# Patient Record
Sex: Female | Born: 1975 | Hispanic: No | Marital: Married | Smoking: Never smoker
Health system: Southern US, Community
[De-identification: ages and names within clinical notes are randomized; demographics above are authoritative.]

## PROBLEM LIST (undated history)

## (undated) DIAGNOSIS — J45909 Unspecified asthma, uncomplicated: Secondary | ICD-10-CM

## (undated) DIAGNOSIS — T7840XA Allergy, unspecified, initial encounter: Secondary | ICD-10-CM

## (undated) HISTORY — DX: Allergy, unspecified, initial encounter: T78.40XA

---

## 2009-08-11 ENCOUNTER — Emergency Department (HOSPITAL_BASED_OUTPATIENT_CLINIC_OR_DEPARTMENT_OTHER): Admission: EM | Admit: 2009-08-11 | Discharge: 2009-08-11 | Payer: Self-pay | Admitting: Emergency Medicine

## 2010-05-17 LAB — URINALYSIS, ROUTINE W REFLEX MICROSCOPIC
Ketones, ur: NEGATIVE mg/dL
Protein, ur: NEGATIVE mg/dL
Urobilinogen, UA: 0.2 mg/dL (ref 0.0–1.0)

## 2010-11-17 ENCOUNTER — Encounter: Payer: Self-pay | Admitting: Physician Assistant

## 2011-07-01 ENCOUNTER — Emergency Department (INDEPENDENT_AMBULATORY_CARE_PROVIDER_SITE_OTHER)
Admission: EM | Admit: 2011-07-01 | Discharge: 2011-07-01 | Disposition: A | Discharge status: Home / Self Care | Payer: Self-pay | Source: Home / Self Care | Attending: Family Medicine | Admitting: Family Medicine

## 2011-07-01 DIAGNOSIS — J45909 Unspecified asthma, uncomplicated: Secondary | ICD-10-CM

## 2011-07-01 MED ORDER — ALBUTEROL SULFATE HFA 108 (90 BASE) MCG/ACT IN AERS: 1.0000 | INHALATION_SPRAY | Freq: Four times a day (QID) | RESPIRATORY_TRACT | Status: DC | PRN

## 2011-07-01 MED ORDER — AZITHROMYCIN 250 MG PO TABS: 250.0000 mg | ORAL_TABLET | Freq: Every day | ORAL | Status: AC

## 2011-07-01 MED ORDER — PREDNISONE 20 MG PO TABS: ORAL_TABLET | ORAL | Status: AC

## 2011-07-01 MED ORDER — METHYLPREDNISOLONE SODIUM SUCC 125 MG IJ SOLR
INTRAMUSCULAR | Status: AC
  Filled 2011-07-01: qty 2

## 2011-07-01 MED ORDER — ACETAMINOPHEN-CODEINE #3 300-30 MG PO TABS: 1.0000 | ORAL_TABLET | Freq: Four times a day (QID) | ORAL | Status: AC | PRN

## 2011-07-01 MED ORDER — IPRATROPIUM BROMIDE 0.02 % IN SOLN
0.5000 mg | Freq: Once | RESPIRATORY_TRACT | Status: AC
  Administered 2011-07-01: 0.5 mg via RESPIRATORY_TRACT

## 2011-07-01 MED ORDER — ALBUTEROL SULFATE (5 MG/ML) 0.5% IN NEBU
5.0000 mg | INHALATION_SOLUTION | Freq: Once | RESPIRATORY_TRACT | Status: AC
  Administered 2011-07-01: 5 mg via RESPIRATORY_TRACT

## 2011-07-01 MED ORDER — METHYLPREDNISOLONE SODIUM SUCC 125 MG IJ SOLR
125.0000 mg | Freq: Once | INTRAMUSCULAR | Status: AC
  Administered 2011-07-01: 125 mg via INTRAMUSCULAR

## 2011-07-01 MED ORDER — ALBUTEROL SULFATE (5 MG/ML) 0.5% IN NEBU
INHALATION_SOLUTION | RESPIRATORY_TRACT | Status: AC
  Filled 2011-07-01: qty 1

## 2011-07-01 NOTE — ED Notes (Signed)
ACCORDING TO PT PER PACIFIC TRANSLATOR PT STATES IM HAVING STOMACH PAINS WITH NAUSEA S/P COUGHING AND SOB,WHEEZING.DR.MORENO IN WITH.PT ABLE TO SPEAK IN SENTENCES AND STATES SHE FEELS MUCH BETTER.SATS 96-97% R/A POST BREATHING TREATMENT

## 2011-07-01 NOTE — ED Notes (Addendum)
Pt brought in by family friend for wheezing,sob that started today according to friend.pt only speaks [employer] LANGUAGE.ON ARRIVAL SOB,USING STOMACH MUSCLES FOR BREATHING ,UNABLE TO SPEAK AND WHEEZING.SATS 95% R/A.2 LITERS [place] APPLIED O2 INCREASED TO 100%.BREATHING TREATMENT STARTED WHILE TRIAGE ASSESS GIVEN TO PROVIDER. WILL USE PACIFIC TRANSLATION LINE WHEN PT ABLE TO SPEAK

## 2011-07-01 NOTE — ED Notes (Signed)
Pt breathing easier.

## 2011-07-01 NOTE — ED Notes (Signed)
Breathing treatment still going.

## 2011-07-01 NOTE — Discharge Instructions (Signed)
And you in my impression is that your developing asthmatic bronchitis.  Working in a [employer] can put you at risk for lung disease and to develop asthma. Take the prescribed medications as instructed.  You need to find a primary care provider who can monitor your symptoms as she might require to use inhalers chronically. Return to the emergency department if worsening symptoms despite following treatment.   B?nh Suy?n ? Ng??i L?n (Asthma, Adult) B?nh suy?n l do ???ng d?n khng kh t?i ph?i b? thu h?p. B?nh ny c th? pht sinh do ph?n, b?i, lng th, ??t x?p, m?t s? th?c ph?m, nhi?m trng ???ng h h?p, ti?p xc v?i khi, t?p th? d?c, tnh tr?ng c?ng th?ng tinh th?n ho?c cc ch?t gy d? ?ng khc (nh?ng th? gy ph?n ?ng d? ?ng ho?c d? ?ng). Nh?ng c?n suy?n l?p l?i l ph? bi?n. H??NG D?N CH?M [place] T?I NH  U?ng thu?c c k ??n theo yu c?u c?a bc s?[place] ti?p xc v?i ph?n, b?i, lng ??ng v?t, ??t x?p, khi v nh?ng th? khc c th? gy ra nh?ng c?n hen suy?n ? nh v t?i n?i lm vi?c.   B?n c th? t b? nh?ng c?n suy?n h?n n?u b?n gi?m b?t b?i trong nh. My ht b?i khng kh b?ng t?nh ?i?n c th? gip b?n.   Thay g?i ho?c n?m b?ng nh?ng ch?t li?u t gy d? ?ng h?n c th? gip cho b?n.   Bo cho Bc s? bi?t k? ho?ch x? tr c?n suy?n t?i nh bao g?m c? vi?c s? d?ng d?ng c? ?o thng kh t?i ?a gip ?nh gi m?c ?? n?ng c?a c?n suy?n. K? ho?ch x? tr c th? gip gi?m ??n m?c t?i thi?u hay ch?m d?t c?n suy?n m khng c?n nh? ??n s? tr? gip v? y t?.   N?u khng b? h?n ch? s? d?ng n??c, hy u?ng 8 ??n 10 ly n??c m?i ngy.   Lun lun c k? ho?ch chu?n b? nh? ??n s? tr? gip v? y t? bao g?m c? vi?c g?i ?i?n tho?i cho Bc s?, ??n trung tm c?p c?u khu v?c, v g?i ?i?n tho?i s? 911 cho nh?ng c?n suy?n tr?m tr?ng.   Trao ??i v?i bc s? c?a b?n v? sinh ho?t t?p th? d?c ??u ??n m b?n c th? lm.   N?u nguyn nhn gy suy?n l lng th, c th? b?n s? ph?i ng?ng nui ??ng v?t trong nh.  HY THAM  V?N V?I CHUYN GIA Y T? N?U:  B?n th? kh kh ho?c th? g?p ngay c? khi ? u?ng thu?c ?? phng ng?a nh?ng c?n suy?n.   B?n b? ?au c?, ?au ng?c ho?c ??m tr? nn ??c l?i.   ??m c?a b?n chuy?n t? mu trong ho?c tr?ng sang vng, xanh, xm ho?c c mu.   B?n c b?t k? tr?c tr?c no c th? lin quan ??n lo?i thu?c b?n ?ang u?ng (v d? nh? pht ban, ng?a, s?ng ho?c kh th?).  HY NGAY L?P T?C THAM V?N V?I CHUYN GIA Y T? N?U:  Lo?i thu?c b?n th??ng dng khng lm b?n h?t th? kh kh, ho?c b?n ngy cng b? ho ho?c th? g?p nhi?u h?n.   B?n ngy cng kh th?.   B?n b? s?t.  HY CH?C CH?N R?NG B?N:  Hi?u r nh?ng h??ng d?n khi xu?t vi?n.   S? theo di tnh tr?ng b?nh c?a b?n.   S? ??n khm b?nh ngay l?p t?c nh? ? ???  c h??ng d?n.  Document Released: 02/14/2005 Document Revised: 02/03/2011 [facility] Patient Information 2012 [place], [place].

## 2011-07-04 NOTE — ED Provider Notes (Signed)
History     CSN: [id number]  Arrival date & time 07/01/11  1214   First MD Initiated Contact with Patient 07/01/11 1301      Chief Complaint  Patient presents with  . Asthma  . Shortness of Breath    (Consider location/radiation/quality/duration/timing/severity/associated sxs/prior treatment) HPI Comments: 36 y/o nonsmoker female, denies h/o of asthma. No english speaker. Here with a family member on acute respitatory distress with shortness of breath, wheezing and productive cough. With the help of a interpreter we were able to communicate with patient. Reports that she's has experienced persistent coughing for about 2 weeks in the last week also symptoms associated with wheezing and shortness of breath also in the last week she's had yellow sputum. No hemoptysis. She has been in [place] for years, currently working in a [employer] where she is exposed to fumes. Denies prior history of asthma or COPD. No fever or chills. Reports pain in upper abdomen bilaterally in both flanks when coughing.      No past medical history on file.  No past surgical history on file.  No family history on file.  History  Substance Use Topics  . Smoking status: Not on file  . Smokeless tobacco: Not on file  . Alcohol Use: Not on file    OB History    No data available      Review of Systems  Constitutional: Positive for appetite change. Negative for fever and chills.  HENT: Positive for congestion and rhinorrhea. Negative for sore throat, trouble swallowing and neck pain.   Respiratory: Positive for cough, chest tightness, shortness of breath and wheezing.   Cardiovascular: Negative for chest pain.  Gastrointestinal: Positive for abdominal pain. Negative for nausea, vomiting and diarrhea.  Skin: Negative for rash.  Neurological: Negative for headaches.    Allergies  Review of patient's allergies indicates not on file.  Home Medications   Current Outpatient Rx  Name Route Sig  Dispense Refill  . ACETAMINOPHEN-CODEINE #3 300-30 MG PO TABS Oral Take 1-2 tablets by mouth every 6 (six) hours as needed for pain. 15 tablet 0  . ALBUTEROL SULFATE HFA 108 (90 BASE) MCG/ACT IN AERS Inhalation Inhale 1-2 puffs into the lungs every 6 (six) hours as needed for wheezing. 1 Inhaler 0  . AZITHROMYCIN 250 MG PO TABS Oral Take 1 tablet (250 mg total) by mouth daily. Take first 2 tablets together, then 1 every day until finished. 6 tablet 0  . PREDNISONE 20 MG PO TABS  2 tabs po daily for 5 days 10 tablet no    SpO2 95%  Physical Exam  Nursing note and vitals reviewed. Constitutional: She is oriented to person, place, and time. She appears well-developed and well-nourished.       Patient   presents with generalized wheezing in mild acute respiratory distress.   HENT:  Head: Normocephalic and atraumatic.  Right Ear: External ear normal.  Left Ear: External ear normal.  Nose: Mucosal edema and rhinorrhea present. Right sinus exhibits no maxillary sinus tenderness and no frontal sinus tenderness. Left sinus exhibits no maxillary sinus tenderness and no frontal sinus tenderness.  Mouth/Throat: Uvula is midline and mucous membranes are normal. Mucous membranes are not cyanotic. Posterior oropharyngeal erythema present. No oropharyngeal exudate, posterior oropharyngeal edema or tonsillar abscesses.  Pulmonary/Chest: Accessory muscle usage present. Tachypnea noted. She is in respiratory distress. She has no rales.       Patient initially in mild acute respiratory distress with generalized wheezing prolonged  expiration, mild orthopnea, no significant tachypnea with oxygen saturation at 95% on room air.   Neurological: She is alert and oriented to person, place, and time.  Skin: No rash noted.    ED Course  Procedures (including critical care time)  Labs Reviewed - No data to display No results found.   1. Asthmatic bronchitis       MDM  The patient was initially placed on 2 L  of oxygen by nasal cannula and her oxygen saturation increase immediately to 100%. Had administered albuterol/ipratropium neb twice and Solu-Medrol 125 mg intramuscular one time. With significant improvement of her lung exam. She was breathing comfortably and had an oxygen saturation of 100% on room air at the time of discharge. Was treated with prednisone 40 mg daily for 5 days,  azithromycin for 5 days, albuterol inhaler every 6 hours when necessary. Asked to followup with primary care provider or return if persistent or worsening symptoms despite following treatment. Also recommend to use mask or change Job environment if persistent symptoms as patient works in a [employer] where she is exposed to fumes.        Sharin Grave, MD 07/04/11 [phone]

## 2012-03-06 ENCOUNTER — Emergency Department (INDEPENDENT_AMBULATORY_CARE_PROVIDER_SITE_OTHER)
Admission: EM | Admit: 2012-03-06 | Discharge: 2012-03-06 | Disposition: A | Discharge status: Home / Self Care | Payer: Managed Care, Other (non HMO) | Source: Home / Self Care | Attending: Emergency Medicine | Admitting: Emergency Medicine

## 2012-03-06 ENCOUNTER — Encounter (HOSPITAL_COMMUNITY): Payer: Self-pay

## 2012-03-06 DIAGNOSIS — Z3201 Encounter for pregnancy test, result positive: Secondary | ICD-10-CM

## 2012-03-06 LAB — POCT PREGNANCY, URINE: Preg Test, Ur: POSITIVE — AB

## 2012-03-06 MED ORDER — PRENATAL VITAMINS PLUS 27-1 MG PO TABS: 1.0000 | ORAL_TABLET | Freq: Every day | ORAL | Status: DC

## 2012-03-06 NOTE — ED Notes (Signed)
Wanting to be tested for poss pregnancy; NAD

## 2012-03-06 NOTE — ED Provider Notes (Signed)
History     CSN: [id number]  Arrival date & time 03/06/12  1723   First MD Initiated Contact with Patient 03/06/12 1732      Chief Complaint  Patient presents with  . Possible Pregnancy    (Consider location/radiation/quality/duration/timing/severity/associated sxs/prior treatment) HPI Comments: 37 year old Asian woman who is here to have a pregnancy test. She states that she has not had a menses for 2 months. She performed a home pregnancy test that was positive. She wants a pregnancy test performed by a physician. She denies pelvic pain or bleeding or cramps. No nausea or vomiting or other associated symptoms.   History reviewed. No pertinent past medical history.  History reviewed. No pertinent past surgical history.  History reviewed. No pertinent family history.  History  Substance Use Topics  . Smoking status: Not on file  . Smokeless tobacco: Not on file  . Alcohol Use: Not on file    OB History    Grav Para Term Preterm Abortions TAB SAB Ect Mult Living                  Review of Systems  Constitutional: Negative.   HENT: Negative.   Respiratory: Negative.   Cardiovascular: Negative.   Gastrointestinal: Negative.   Genitourinary: Negative.   Musculoskeletal: Negative.   Skin: Negative.   Neurological: Negative.   Psychiatric/Behavioral: Negative.     Allergies  Review of patient's allergies indicates no known allergies.  Home Medications   Current Outpatient Rx  Name  Route  Sig  Dispense  Refill  . ALBUTEROL SULFATE HFA 108 (90 BASE) MCG/ACT IN AERS   Inhalation   Inhale 1-2 puffs into the lungs every 6 (six) hours as needed for wheezing.   1 Inhaler   0   . PRENATAL VITAMINS PLUS 27-1 MG PO TABS   Oral   Take 1 tablet by mouth daily. 1 tab po once daily   30 tablet   0     BP 127/77  Pulse 80  Temp 99.2 F (37.3 C) (Oral)  Resp 18  SpO2 100%  Physical Exam  Nursing note and vitals reviewed. Constitutional: She appears  well-developed and well-nourished. No distress.  Eyes: EOM are normal.  Neck: Normal range of motion. Neck supple.  Cardiovascular: Normal rate and normal heart sounds.   Pulmonary/Chest: Effort normal.  Abdominal: Soft. There is no tenderness.  Musculoskeletal: Normal range of motion. She exhibits no edema.  Neurological: She is alert.  Skin: Skin is warm and dry.  Psychiatric: She has a normal mood and affect.    ED Course  Procedures (including critical care time)  Labs Reviewed  POCT PREGNANCY, URINE - Abnormal; Notable for the following:    Preg Test, Ur POSITIVE (*)     All other components within normal limits   No results found.   1. Pregnancy examination or test, positive result       MDM  Pregnancy test is positive. She is unable to give me an exact date of her LMP. Will prescribe prenatal vitamins She is to call her obstetrician for an appointment for prenatal care. For any problems such as pelvic pain, bleeding, excessive vomiting he should go to the women's center.        Hayden Rasmussen, NP 03/06/12 1842  Hayden Rasmussen, NP 03/06/12 [phone]

## 2012-03-07 NOTE — ED Provider Notes (Signed)
Medical screening examination/treatment/procedure(s) were performed by resident physician or non-physician practitioner and as supervising physician I was immediately available for consultation/collaboration.   KINDL,JAMES DOUGLAS MD.    James D Kindl, MD 03/07/12 2055 

## 2012-04-25 ENCOUNTER — Encounter (HOSPITAL_COMMUNITY): Payer: Managed Care, Other (non HMO)

## 2012-04-25 ENCOUNTER — Other Ambulatory Visit (HOSPITAL_COMMUNITY): Payer: Managed Care, Other (non HMO)

## 2012-05-02 ENCOUNTER — Other Ambulatory Visit (HOSPITAL_COMMUNITY): Payer: Self-pay | Admitting: Obstetrics and Gynecology

## 2012-05-03 ENCOUNTER — Ambulatory Visit (HOSPITAL_COMMUNITY): Admission: RE | Admit: 2012-05-03 | Payer: Managed Care, Other (non HMO) | Source: Ambulatory Visit

## 2012-05-03 ENCOUNTER — Ambulatory Visit (HOSPITAL_COMMUNITY): Payer: Managed Care, Other (non HMO) | Attending: Obstetrics and Gynecology

## 2013-12-18 ENCOUNTER — Other Ambulatory Visit (HOSPITAL_COMMUNITY): Payer: Self-pay | Admitting: Obstetrics & Gynecology

## 2013-12-18 DIAGNOSIS — N979 Female infertility, unspecified: Secondary | ICD-10-CM

## 2013-12-20 ENCOUNTER — Ambulatory Visit (HOSPITAL_COMMUNITY)
Admission: RE | Admit: 2013-12-20 | Discharge: 2013-12-20 | Disposition: A | Discharge status: Home / Self Care | Payer: Managed Care, Other (non HMO) | Source: Ambulatory Visit | Attending: Obstetrics & Gynecology | Admitting: Obstetrics & Gynecology

## 2013-12-20 DIAGNOSIS — N979 Female infertility, unspecified: Secondary | ICD-10-CM | POA: Diagnosis present

## 2013-12-20 MED ORDER — IOHEXOL 300 MG/ML  SOLN
20.0000 mL | Freq: Once | INTRAMUSCULAR | Status: AC | PRN
  Administered 2013-12-20: 20 mL

## 2014-05-28 ENCOUNTER — Emergency Department (HOSPITAL_COMMUNITY)
Admission: EM | Admit: 2014-05-28 | Discharge: 2014-05-28 | Disposition: A | Discharge status: Home / Self Care | Payer: Managed Care, Other (non HMO) | Source: Home / Self Care | Attending: Family Medicine | Admitting: Family Medicine

## 2014-05-28 ENCOUNTER — Encounter (HOSPITAL_COMMUNITY): Payer: Self-pay | Admitting: *Deleted

## 2014-05-28 DIAGNOSIS — J4531 Mild persistent asthma with (acute) exacerbation: Secondary | ICD-10-CM

## 2014-05-28 DIAGNOSIS — J301 Allergic rhinitis due to pollen: Secondary | ICD-10-CM

## 2014-05-28 HISTORY — DX: Unspecified asthma, uncomplicated: J45.909

## 2014-05-28 MED ORDER — ALBUTEROL SULFATE (2.5 MG/3ML) 0.083% IN NEBU
INHALATION_SOLUTION | RESPIRATORY_TRACT | Status: AC
  Filled 2014-05-28: qty 6

## 2014-05-28 MED ORDER — ALBUTEROL SULFATE (2.5 MG/3ML) 0.083% IN NEBU
5.0000 mg | INHALATION_SOLUTION | Freq: Once | RESPIRATORY_TRACT | Status: AC
  Administered 2014-05-28: 5 mg via RESPIRATORY_TRACT

## 2014-05-28 MED ORDER — IPRATROPIUM-ALBUTEROL 0.5-2.5 (3) MG/3ML IN SOLN
3.0000 mL | Freq: Once | RESPIRATORY_TRACT | Status: AC
  Administered 2014-05-28: 3 mL via RESPIRATORY_TRACT

## 2014-05-28 MED ORDER — TRIAMCINOLONE ACETONIDE 40 MG/ML IJ SUSP
INTRAMUSCULAR | Status: AC
  Filled 2014-05-28: qty 1

## 2014-05-28 MED ORDER — ALBUTEROL SULFATE (2.5 MG/3ML) 0.083% IN NEBU
2.5000 mg | INHALATION_SOLUTION | Freq: Once | RESPIRATORY_TRACT | Status: AC
  Administered 2014-05-28: 2.5 mg via RESPIRATORY_TRACT

## 2014-05-28 MED ORDER — BECLOMETHASONE DIPROPIONATE 80 MCG/ACT IN AERS: 1.0000 | INHALATION_SPRAY | Freq: Two times a day (BID) | RESPIRATORY_TRACT | Status: DC

## 2014-05-28 MED ORDER — CETIRIZINE HCL 10 MG PO TABS: 10.0000 mg | ORAL_TABLET | Freq: Every day | ORAL | Status: DC

## 2014-05-28 MED ORDER — PREDNISONE 20 MG PO TABS: ORAL_TABLET | ORAL | Status: DC

## 2014-05-28 MED ORDER — TRIAMCINOLONE ACETONIDE 40 MG/ML IJ SUSP
40.0000 mg | Freq: Once | INTRAMUSCULAR | Status: AC
  Administered 2014-05-28: 40 mg via INTRAMUSCULAR

## 2014-05-28 MED ORDER — ALBUTEROL SULFATE HFA 108 (90 BASE) MCG/ACT IN AERS: 2.0000 | INHALATION_SPRAY | RESPIRATORY_TRACT | Status: DC | PRN

## 2014-05-28 NOTE — ED Notes (Signed)
Pt  Reports   She  Has  Allergies      She  Reports  For  The  Last   sev  Days  She  Has  Shortness   Of  Breath   And  Wheezing    she  Is  Sitting  Upright on  The  Exam table  Speaking in  Complete  sentances  And  Appears  In no  Severe  Distress

## 2014-05-28 NOTE — ED Provider Notes (Signed)
CSN: [id number]     Arrival date & time 05/28/14  0901 History   First MD Initiated Contact with Patient 05/28/14 1016     Chief Complaint  Patient presents with  . Wheezing   (Consider location/radiation/quality/duration/timing/severity/associated sxs/prior Treatment) HPI Comments: 39 year old female accompanied by significant other complaining of seasonal allergies, problems with breathing with a history of asthma. She has no inhaler and no PCP.  Patient is a 39 y.o. female presenting with wheezing.  Wheezing Associated symptoms: cough, shortness of breath and sore throat   Associated symptoms: no fatigue, no fever and no rhinorrhea     Past Medical History  Diagnosis Date  . Asthma    History reviewed. No pertinent past surgical history. History reviewed. No pertinent family history. History  Substance Use Topics  . Smoking status: Never Smoker   . Smokeless tobacco: Not on file  . Alcohol Use: No   OB History    No data available     Review of Systems  Constitutional: Negative for fever and fatigue.  HENT: Positive for sore throat. Negative for congestion, postnasal drip and rhinorrhea.   Eyes: Negative.   Respiratory: Positive for cough, shortness of breath and wheezing.   Gastrointestinal: Negative.     Allergies  Review of patient's allergies indicates no known allergies.  Home Medications   Prior to Admission medications   Medication Sig Start Date End Date Taking? Authorizing Provider  albuterol (PROVENTIL HFA;VENTOLIN HFA) 108 (90 BASE) MCG/ACT inhaler Inhale 2 puffs into the lungs every 4 (four) hours as needed for wheezing or shortness of breath. 05/28/14   Hayden Rasmussenavid Vontae Court, NP  beclomethasone (QVAR) 80 MCG/ACT inhaler Inhale 1 puff into the lungs 2 (two) times daily. 05/28/14   Hayden Rasmussenavid Emilyrose Darrah, NP  cetirizine (ZYRTEC ALLERGY) 10 MG tablet Take 1 tablet (10 mg total) by mouth daily. 05/28/14   Hayden Rasmussenavid Namon Villarin, NP  predniSONE (DELTASONE) 20 MG tablet Take 3 tabs po on first  day, 3 tabs second day, 2 tabs third day, 2 tab fourth day, 1 tab 5th day. Take with food. Start 05/29/14 05/28/14   Hayden Rasmussenavid Iantha Titsworth, NP   BP 135/92 mmHg  Pulse 87  Temp(Src) 97.9 F (36.6 C) (Oral)  Resp 12  SpO2 100%  LMP 05/21/2014 Physical Exam  Constitutional: She is oriented to person, place, and time. She appears well-developed and well-nourished. No distress.  HENT:  Mouth/Throat: No oropharyngeal exudate.  Bilateral TMs are retracted. No erythema or effusion. Oropharynx with minor erythema and clear PND  Eyes: Conjunctivae and EOM are normal.  Neck: Normal range of motion. Neck supple.  Cardiovascular: Normal rate, regular rhythm and normal heart sounds.   Pulmonary/Chest: Effort normal. No respiratory distress. She has wheezes. She has no rales.  Lymphadenopathy:    She has no cervical adenopathy.  Neurological: She is alert and oriented to person, place, and time.  Skin: Skin is warm and dry.  Nursing note and vitals reviewed.   ED Course  Procedures (including critical care time) Labs Review Labs Reviewed - No data to display  Imaging Review No results found.   MDM   1. Allergic rhinitis due to pollen   2. Asthma exacerbation attacks, mild persistent    15 minutes post DuoNeb the patient has modest improvement in air movement. There still loud bilateral wheezing. Administer a second albuterol 5 mg neb. Kenalog 40 mg IM administered 12:00 noon post albuterol neb patient states she is breathing little bit better. There is improved air movement and  wheezes have decreased. She has no cough. Discharged home in stable condition with prescription for albuterol HFA, prednisone taper dose, Qvar 80 twice a day, Zyrtec 10 mg daily, Must obtain follow-up with primary care doctor as soon as possible. Call the numbers listed on page one.   Hayden Rasmussen, NP 05/28/14 [phone]

## 2014-05-28 NOTE — Discharge Instructions (Signed)
Allergic Rhinitis Allergic rhinitis is when the mucous membranes in the nose respond to allergens. Allergens are particles in the air that cause your body to have an allergic reaction. This causes you to release allergic antibodies. Through a chain of events, these eventually cause you to release histamine into the blood stream. Although meant to protect the body, it is this release of histamine that causes your discomfort, such as frequent sneezing, congestion, and an itchy, runny nose.  CAUSES  Seasonal allergic rhinitis (hay fever) is caused by pollen allergens that may come from grasses, trees, and weeds. Year-round allergic rhinitis (perennial allergic rhinitis) is caused by allergens such as house dust mites, pet dander, and mold spores.  SYMPTOMS  1. Nasal stuffiness (congestion). 2. Itchy, runny nose with sneezing and tearing of the eyes. DIAGNOSIS  Your health care provider can help you determine the allergen or allergens that trigger your symptoms. If you and your health care provider are unable to determine the allergen, skin or blood testing may be used. TREATMENT  Allergic rhinitis does not have a cure, but it can be controlled by:  Medicines and allergy shots (immunotherapy).  Avoiding the allergen. Hay fever may often be treated with antihistamines in pill or nasal spray forms. Antihistamines block the effects of histamine. There are over-the-counter medicines that may help with nasal congestion and swelling around the eyes. Check with your health care provider before taking or giving this medicine.  If avoiding the allergen or the medicine prescribed do not work, there are many new medicines your health care provider can prescribe. Stronger medicine may be used if initial measures are ineffective. Desensitizing injections can be used if medicine and avoidance does not work. Desensitization is when a patient is given ongoing shots until the body becomes less sensitive to the allergen.  Make sure you follow up with your health care provider if problems continue. HOME CARE INSTRUCTIONS It is not possible to completely avoid allergens, but you can reduce your symptoms by taking steps to limit your exposure to them. It helps to know exactly what you are allergic to so that you can avoid your specific triggers. SEEK MEDICAL CARE IF:   You have a fever.  You develop a cough that does not stop easily (persistent).  You have shortness of breath.  You start wheezing.  Symptoms interfere with normal daily activities. Document Released: 11/09/2000 Document Revised: 02/19/2013 Document Reviewed: 10/22/2012 [facility] Patient Information 2015 [place], [place]. This information is not intended to replace advice given to you by your health care provider. Make sure you discuss any questions you have with your health care provider.  Asthma Attack Prevention Although there is no way to prevent asthma from starting, you can take steps to control the disease and reduce its symptoms. Learn about your asthma and how to control it. Take an active role to control your asthma by working with your health care provider to create and follow an asthma action plan. An asthma action plan guides you in: 3. Taking your medicines properly. 4. Avoiding things that set off your asthma or make your asthma worse (asthma triggers). 5. Tracking your level of asthma control. 6. Responding to worsening asthma. 7. Seeking emergency care when needed. To track your asthma, keep records of your symptoms, check your peak flow number using a handheld device that shows how well air moves out of your lungs (peak flow meter), and get regular asthma checkups.  WHAT ARE SOME WAYS TO PREVENT AN ASTHMA ATTACK?  Take medicines as directed by your health care provider.  Keep track of your asthma symptoms and level of control.  With your health care provider, write a detailed plan for taking medicines and managing an asthma  attack. Then be sure to follow your action plan. Asthma is an ongoing condition that needs regular monitoring and treatment.  Identify and avoid asthma triggers. Many outdoor allergens and irritants (such as pollen, mold, cold air, and air pollution) can trigger asthma attacks. Find out what your asthma triggers are and take steps to avoid them.  Monitor your breathing. Learn to recognize warning signs of an attack, such as coughing, wheezing, or shortness of breath. Your lung function may decrease before you notice any signs or symptoms, so regularly measure and record your peak airflow with a home peak flow meter.  Identify and treat attacks early. If you act quickly, you are less likely to have a severe attack. You will also need less medicine to control your symptoms. When your peak flow measurements decrease and alert you to an upcoming attack, take your medicine as instructed and immediately stop any activity that may have triggered the attack. If your symptoms do not improve, get medical help.  Pay attention to increasing quick-relief inhaler use. If you find yourself relying on your quick-relief inhaler, your asthma is not under control. See your health care provider about adjusting your treatment. WHAT CAN MAKE MY SYMPTOMS WORSE? A number of common things can set off or make your asthma symptoms worse and cause temporary increased inflammation of your airways. Keep track of your asthma symptoms for several weeks, detailing all the environmental and emotional factors that are linked with your asthma. When you have an asthma attack, go back to your asthma diary to see which factor, or combination of factors, might have contributed to it. Once you know what these factors are, you can take steps to control many of them. If you have allergies and asthma, it is important to take asthma prevention steps at home. Minimizing contact with the substance to which you are allergic will help prevent an asthma  attack. Some triggers and ways to avoid these triggers are: Animal Dander:  Some people are allergic to the flakes of skin or dried saliva from animals with fur or feathers.   There is no such thing as a hypoallergenic dog or cat breed. All dogs or cats can cause allergies, even if they don't shed.  Keep these pets out of your home.  If you are not able to keep a pet outdoors, keep the pet out of your bedroom and other sleeping areas at all times, and keep the door closed.  Remove carpets and furniture covered with cloth from your home. If that is not possible, keep the pet away from fabric-covered furniture and carpets. Dust Mites: Many people with asthma are allergic to dust mites. Dust mites are tiny bugs that are found in every home in mattresses, pillows, carpets, fabric-covered furniture, bedcovers, clothes, stuffed toys, and other fabric-covered items.   Cover your mattress in a special dust-proof cover.  Cover your pillow in a special dust-proof cover, or wash the pillow each week in hot water. Water must be hotter than 130 F (54.4 C) to kill dust mites. Cold or warm water used with detergent and bleach can also be effective.  Wash the sheets and blankets on your bed each week in hot water.  Try not to sleep or lie on cloth-covered cushions.  Call ahead  when traveling and ask for a smoke-free hotel room. Bring your own bedding and pillows in case the hotel only supplies feather pillows and down comforters, which may contain dust mites and cause asthma symptoms.  Remove carpets from your bedroom and those laid on concrete, if you can.  Keep stuffed toys out of the bed, or wash the toys weekly in hot water or cooler water with detergent and bleach. Cockroaches: Many people with asthma are allergic to the droppings and remains of cockroaches.   Keep food and garbage in closed containers. Never leave food out.  Use poison baits, traps, powders, gels, or paste (for example, boric  acid).  If a spray is used to kill cockroaches, stay out of the room until the odor goes away. Indoor Mold:  Fix leaky faucets, pipes, or other sources of water that have mold around them.  Clean floors and moldy surfaces with a fungicide or diluted bleach.  Avoid using humidifiers, vaporizers, or swamp coolers. These can spread molds through the air. Pollen and Outdoor Mold:  When pollen or mold spore counts are high, try to keep your windows closed.  Stay indoors with windows closed from late morning to afternoon. Pollen and some mold spore counts are highest at that time.  Ask your health care provider whether you need to take anti-inflammatory medicine or increase your dose of the medicine before your allergy season starts. Other Irritants to Avoid:  Tobacco smoke is an irritant. If you smoke, ask your health care provider how you can quit. Ask family members to quit smoking, too. Do not allow smoking in your home or car.  If possible, do not use a wood-burning stove, kerosene heater, or fireplace. Minimize exposure to all sources of smoke, including incense, candles, fires, and fireworks.  Try to stay away from strong odors and sprays, such as perfume, talcum powder, hair spray, and paints.  Decrease humidity in your home and use an indoor air cleaning device. Reduce indoor humidity to below 60%. Dehumidifiers or central air conditioners can do this.  Decrease house dust exposure by changing furnace and air cooler filters frequently.  Try to have someone else vacuum for you once or twice a week. Stay out of rooms while they are being vacuumed and for a short while afterward.  If you vacuum, use a dust mask from a hardware store, a double-layered or microfilter vacuum cleaner bag, or a vacuum cleaner with a HEPA filter.  Sulfites in foods and beverages can be irritants. Do not drink beer or wine or eat dried fruit, processed potatoes, or shrimp if they cause asthma  symptoms.  Cold air can trigger an asthma attack. Cover your nose and mouth with a scarf on cold or windy days.  Several health conditions can make asthma more difficult to manage, including a runny nose, sinus infections, reflux disease, psychological stress, and sleep apnea. Work with your health care provider to manage these conditions.  Avoid close contact with people who have a respiratory infection such as a cold or the flu, since your asthma symptoms may get worse if you catch the infection. Wash your hands thoroughly after touching items that may have been handled by people with a respiratory infection.  Get a flu shot every year to protect against the flu virus, which often makes asthma worse for days or weeks. Also get a pneumonia shot if you have not previously had one. Unlike the flu shot, the pneumonia shot does not need to be given  yearly. Medicines:  Talk to your health care provider about whether it is safe for you to take aspirin or non-steroidal anti-inflammatory medicines (NSAIDs). In a small number of people with asthma, aspirin and NSAIDs can cause asthma attacks. These medicines must be avoided by people who have known aspirin-sensitive asthma. It is important that people with aspirin-sensitive asthma read labels of all over-the-counter medicines used to treat pain, colds, coughs, and fever.  Beta-blockers and ACE inhibitors are other medicines you should discuss with your health care provider. HOW CAN I FIND OUT WHAT I AM ALLERGIC TO? Ask your asthma health care provider about allergy skin testing or blood testing (the RAST test) to identify the allergens to which you are sensitive. If you are found to have allergies, the most important thing to do is to try to avoid exposure to any allergens that you are sensitive to as much as possible. Other treatments for allergies, such as medicines and allergy shots (immunotherapy) are available.  CAN I EXERCISE? Follow your health care  provider's advice regarding asthma treatment before exercising. It is important to maintain a regular exercise program, but vigorous exercise or exercise in cold, humid, or dry environments can cause asthma attacks, especially for those people who have exercise-induced asthma. Document Released: 02/02/2009 Document Revised: 02/19/2013 Document Reviewed: 08/22/2012 [facility] Patient Information 2015 [place], [place]. This information is not intended to replace advice given to you by your health care provider. Make sure you discuss any questions you have with your health care provider.  Asthma, Acute Bronchospasm Acute bronchospasm caused by asthma is also referred to as an asthma attack. Bronchospasm means your air passages become narrowed. The narrowing is caused by inflammation and tightening of the muscles in the air tubes (bronchi) in your lungs. This can make it hard to breathe or cause you to wheeze and cough. CAUSES Possible triggers are: 8. Animal dander from the skin, hair, or feathers of animals. 9. Dust mites contained in house dust. 10. Cockroaches. 11. Pollen from trees or grass. 12. Mold. 13. Cigarette or tobacco smoke. 14. Air pollutants such as dust, household cleaners, hair sprays, aerosol sprays, paint fumes, strong chemicals, or strong odors. 15. Cold air or weather changes. Cold air may trigger inflammation. Winds increase molds and pollens in the air. 16. Strong emotions such as crying or laughing hard. 17. Stress. 18. Certain medicines such as aspirin or beta-blockers. 19. Sulfites in foods and drinks, such as dried fruits and wine. 20. Infections or inflammatory conditions, such as a flu, cold, or inflammation of the nasal membranes (rhinitis). 21. Gastroesophageal reflux disease (GERD). GERD is a condition where stomach acid backs up into your esophagus. 22. Exercise or strenuous activity. SIGNS AND SYMPTOMS   Wheezing.  Excessive coughing, particularly at  night.  Chest tightness.  Shortness of breath. DIAGNOSIS  Your health care provider will ask you about your medical history and perform a physical exam. A chest X-ray or blood testing may be performed to look for other causes of your symptoms or other conditions that may have triggered your asthma attack. TREATMENT  Treatment is aimed at reducing inflammation and opening up the airways in your lungs. Most asthma attacks are treated with inhaled medicines. These include quick relief or rescue medicines (such as bronchodilators) and controller medicines (such as inhaled corticosteroids). These medicines are sometimes given through an inhaler or a nebulizer. Systemic steroid medicine taken by mouth or given through an IV tube also can be used to reduce the inflammation when  an attack is moderate or severe. Antibiotic medicines are only used if a bacterial infection is present.  HOME CARE INSTRUCTIONS   Rest.  Drink plenty of liquids. This helps the mucus to remain thin and be easily coughed up. Only use caffeine in moderation and do not use alcohol until you have recovered from your illness.  Do not smoke. Avoid being exposed to secondhand smoke.  You play a critical role in keeping yourself in good health. Avoid exposure to things that cause you to wheeze or to have breathing problems.  Keep your medicines up-to-date and available. Carefully follow your health care provider's treatment plan.  Take your medicine exactly as prescribed.  When pollen or pollution is bad, keep windows closed and use an air conditioner or go to places with air conditioning.  Asthma requires careful medical care. See your health care provider for a follow-up as advised. If you are more than [redacted] weeks pregnant and you were prescribed any new medicines, let your obstetrician know about the visit and how you are doing. Follow up with your health care provider as directed.  After you have recovered from your asthma  attack, make an appointment with your outpatient doctor to talk about ways to reduce the likelihood of future attacks. If you do not have a doctor who manages your asthma, make an appointment with a primary care doctor to discuss your asthma. SEEK IMMEDIATE MEDICAL CARE IF:   You are getting worse.  You have trouble breathing. If severe, call your local emergency services (911 in the [place]  You develop chest pain or discomfort.  You are vomiting.  You are not able to keep fluids down.  You are coughing up yellow, green, brown, or bloody sputum.  You have a fever and your symptoms suddenly get worse.  You have trouble swallowing. MAKE SURE YOU:   Understand these instructions.  Will watch your condition.  Will get help right away if you are not doing well or get worse. Document Released: 06/01/2006 Document Revised: 02/19/2013 Document Reviewed: 08/22/2012 [facility] Patient Information 2015 [place], [place]. This information is not intended to replace advice given to you by your health care provider. Make sure you discuss any questions you have with your health care provider.  How to Use an Inhaler Using your inhaler correctly is very important. Good technique will make sure that the medicine reaches your lungs.  HOW TO USE AN INHALER: 23. Take the cap off the inhaler. 24. If this is the first time using your inhaler, you need to prime it. Shake the inhaler for 5 seconds. Release four puffs into the air, away from your face. Ask your doctor for help if you have questions. 25. Shake the inhaler for 5 seconds. 26. Turn the inhaler so the bottle is above the mouthpiece. 27. Put your pointer finger on top of the bottle. Your thumb holds the bottom of the inhaler. 28. Open your mouth. 29. Either hold the inhaler away from your mouth (the width of 2 fingers) or place your lips tightly around the mouthpiece. Ask your doctor which way to use your inhaler. 30. Breathe out as much air as  possible. 31. Breathe in and push down on the bottle 1 time to release the medicine. You will feel the medicine go in your mouth and throat. 32. Continue to take a deep breath in very slowly. Try to fill your lungs. 33. After you have breathed in completely, hold your breath for 10 seconds. This will  help the medicine to settle in your lungs. If you cannot hold your breath for 10 seconds, hold it for as long as you can before you breathe out. 34. Breathe out slowly, through pursed lips. Whistling is an example of pursed lips. 35. If your doctor has told you to take more than 1 puff, wait at least 15-30 seconds between puffs. This will help you get the best results from your medicine. Do not use the inhaler more than your doctor tells you to. 36. Put the cap back on the inhaler. 37. Follow the directions from your doctor or from the inhaler package about cleaning the inhaler. If you use more than one inhaler, ask your doctor which inhalers to use and what order to use them in. Ask your doctor to help you figure out when you will need to refill your inhaler.  If you use a steroid inhaler, always rinse your mouth with water after your last puff, gargle and spit out the water. Do not swallow the water. GET HELP IF:  The inhaler medicine only partially helps to stop wheezing or shortness of breath.  You are having trouble using your inhaler.  You have some increase in thick spit (phlegm). GET HELP RIGHT AWAY IF:  The inhaler medicine does not help your wheezing or shortness of breath or you have tightness in your chest.  You have dizziness, headaches, or fast heart rate.  You have chills, fever, or night sweats.  You have a large increase of thick spit, or your thick spit is bloody. MAKE SURE YOU:   Understand these instructions.  Will watch your condition.  Will get help right away if you are not doing well or get worse. Document Released: 11/24/2007 Document Revised: 12/05/2012 Document  Reviewed: 09/13/2012 [facility] Patient Information 2015 [place], [place]. This information is not intended to replace advice given to you by your health care provider. Make sure you discuss any questions you have with your health care provider.

## 2016-04-30 ENCOUNTER — Ambulatory Visit (INDEPENDENT_AMBULATORY_CARE_PROVIDER_SITE_OTHER): Payer: Managed Care, Other (non HMO) | Admitting: Urgent Care

## 2016-04-30 VITALS — BP 122/76 | HR 68 | Temp 98.1°F | Resp 16 | Ht 61.0 in | Wt 133.0 lb

## 2016-04-30 DIAGNOSIS — R1013 Epigastric pain: Secondary | ICD-10-CM | POA: Diagnosis not present

## 2016-04-30 DIAGNOSIS — R21 Rash and other nonspecific skin eruption: Secondary | ICD-10-CM | POA: Diagnosis not present

## 2016-04-30 DIAGNOSIS — Z23 Encounter for immunization: Secondary | ICD-10-CM | POA: Diagnosis not present

## 2016-04-30 DIAGNOSIS — Z0000 Encounter for general adult medical examination without abnormal findings: Secondary | ICD-10-CM | POA: Diagnosis not present

## 2016-04-30 MED ORDER — OMEPRAZOLE 20 MG PO CPDR: 20.0000 mg | DELAYED_RELEASE_CAPSULE | Freq: Every day | ORAL | 3 refills | Status: AC

## 2016-04-30 MED ORDER — CETIRIZINE HCL 10 MG PO TABS: 10.0000 mg | ORAL_TABLET | Freq: Every day | ORAL | 0 refills | Status: AC

## 2016-04-30 MED ORDER — RANITIDINE HCL 150 MG PO TABS: 150.0000 mg | ORAL_TABLET | Freq: Two times a day (BID) | ORAL | 0 refills | Status: AC

## 2016-04-30 NOTE — Patient Instructions (Addendum)
    Food Choices for Gastroesophageal Reflux Disease, Adult When you have gastroesophageal reflux disease (GERD), the foods you eat and your eating habits are very important. Choosing the right foods can help ease your discomfort. What guidelines do I need to follow?  Choose fruits, vegetables, whole grains, and low-fat dairy products.  Choose low-fat meat, fish, and poultry.  Limit fats such as oils, salad dressings, butter, nuts, and avocado.  Keep a food diary. This helps you identify foods that cause symptoms.  Avoid foods that cause symptoms. These may be different for everyone.  Eat small meals often instead of 3 large meals a day.  Eat your meals slowly, in a place where you are relaxed.  Limit fried foods.  Cook foods using methods other than frying.  Avoid drinking alcohol.  Avoid drinking large amounts of liquids with your meals.  Avoid bending over or lying down until 2-3 hours after eating. What foods are not recommended? These are some foods and drinks that may make your symptoms worse: Vegetables Tomatoes. Tomato juice. Tomato and spaghetti sauce. Chili peppers. Onion and garlic. Horseradish. Fruits Oranges, grapefruit, and lemon (fruit and juice). Meats High-fat meats, fish, and poultry. This includes hot dogs, ribs, ham, sausage, salami, and bacon. Dairy Whole milk and chocolate milk. Sour cream. Cream. Butter. Ice cream. Cream cheese. Drinks Coffee and tea. Bubbly (carbonated) drinks or energy drinks. Condiments Hot sauce. Barbecue sauce. Sweets/Desserts Chocolate and cocoa. Donuts. Peppermint and spearmint. Fats and Oils High-fat foods. This includes French fries and potato chips. Other Vinegar. Strong spices. This includes black pepper, white pepper, red pepper, cayenne, curry powder, cloves, ginger, and chili powder. The items listed above may not be a complete list of foods and drinks to avoid. Contact your dietitian for more information. This  information is not intended to replace advice given to you by your health care provider. Make sure you discuss any questions you have with your health care provider. Document Released: 08/16/2011 Document Revised: 07/23/2015 Document Reviewed: 12/19/2012 [employer] Interactive Patient Education  2017 [employer]   IF you received an x-ray today, you will receive an invoice from [facility]. Please contact [facility] at [phone] with questions or concerns regarding your invoice.   IF you received labwork today, you will receive an invoice from [facility]. Please contact [facility] at [phone] with questions or concerns regarding your invoice.   Our billing staff will not be able to assist you with questions regarding bills from these companies.  You will be contacted with the lab results as soon as they are available. The fastest way to get your results is to activate your My Chart account. Instructions are located on the last page of this paperwork. If you have not heard from us regarding the results in 2 weeks, please contact this office.      

## 2016-04-30 NOTE — Progress Notes (Signed)
MRN: [id number]  Subjective:   Ms. Guilford ShiHlip Monfils is a 41 y.o. female presenting for annual physical exam. Patient is married, presents with her husband today. Has good relationships at home, has a good support network. Denies smoking cigarettes or drinking alcohol.   Medical care team includes: PCP: No primary care provider on file. Vision: No vision insurance. Dental: No dental insurance.   Melady does not have any active problems on his problem list. Kaeleigh is not currently taking any medications. She has No Known Allergies. Alexiya  has a past medical history of Asthma. Also denies past surgical history. Denies family history of cancer, diabetes, HTN, HL, heart disease, stroke, mental illness.   Immunizations: Will update influenza and tdap today.  Review of Systems  Constitutional: Negative for chills, diaphoresis, fever, malaise/fatigue and weight loss.  HENT: Negative for congestion, ear discharge, ear pain, hearing loss, nosebleeds, sore throat and tinnitus.   Eyes: Negative for blurred vision, double vision, photophobia, pain, discharge and redness.  Respiratory: Negative for cough, shortness of breath and wheezing.   Cardiovascular: Negative for chest pain, palpitations and leg swelling.  Gastrointestinal: Positive for heartburn (worse after eating large meals). Negative for abdominal pain, blood in stool, constipation, diarrhea, nausea and vomiting.  Genitourinary: Negative for dysuria, flank pain, frequency, hematuria and urgency.  Musculoskeletal: Negative for back pain, joint pain and myalgias.  Skin: Positive for rash (2 day history of itchy rash over forearms and back now improved). Negative for itching.  Neurological: Negative for dizziness, tingling, seizures, loss of consciousness, weakness and headaches.  Endo/Heme/Allergies: Negative for polydipsia.  Psychiatric/Behavioral: Negative for depression, hallucinations, memory loss, substance abuse and suicidal ideas. The patient is  not nervous/anxious and does not have insomnia.    Objective:   Vitals: BP 122/76   Pulse 68   Temp 98.1 F (36.7 C) (Oral)   Resp 16   Ht 5\' 1"  (1.549 m)   Wt 133 lb (60.3 kg)   SpO2 100%   BMI 25.13 kg/m   Physical Exam  Constitutional: She is oriented to person, place, and time. She appears well-developed and well-nourished.  HENT:  TM's intact bilaterally, no effusions or erythema. Nasal turbinates pink and moist, nasal passages patent. No sinus tenderness. Oropharynx clear, mucous membranes moist, dentition in good repair.  Eyes: Conjunctivae and EOM are normal. Pupils are equal, round, and reactive to light. Right eye exhibits no discharge. Left eye exhibits no discharge. No scleral icterus.  Neck: Normal range of motion. Neck supple. No thyromegaly present.  Cardiovascular: Normal rate, regular rhythm and intact distal pulses.  Exam reveals no gallop and no friction rub.   No murmur heard. Pulmonary/Chest: No respiratory distress. She has no wheezes. She has no rales.  Abdominal: Soft. Bowel sounds are normal. She exhibits no distension and no mass. There is tenderness (epigastric).  Musculoskeletal: Normal range of motion. She exhibits no edema or tenderness.  Lymphadenopathy:    She has no cervical adenopathy.  Neurological: She is alert and oriented to person, place, and time. She has normal reflexes. She displays normal reflexes.  Skin: Skin is warm and dry. Capillary refill takes less than 2 seconds. Rash (scattered and resolving urticarial lesions over forearms and back bilaterally) noted. No erythema. No pallor.  Psychiatric: She has a normal mood and affect.   Assessment and Plan :   1. Annual physical exam - Medically stable and pleasant woman, labs pending. Discussed healthy lifestyle, diet, exercise, preventative care, vaccinations, and addressed patient's concerns.  -  Basic metabolic panel - TSH - Lipid panel - CBC  2. Need for prophylactic vaccination  and inoculation against influenza - Flu Vaccine QUAD 36+ mos IM  3. Need for Tdap vaccination - Tdap vaccine greater than or equal to 7yo IM  4. Epigastric pain - Start omeprazole and Zantac to address GERD.   5. Rash and nonspecific skin eruption - Will manage as allergic rash given nature. Start Zyrtec with Zantac, rtc in 1 week if no improvement or sooner if worsening.  Wallis Bamberg, PA-C [facility] [phone] 04/30/2016  12:13 PM

## 2016-04-30 NOTE — Addendum Note (Signed)
Addended by: Wallis BambergMANI, Jaylin Roundy on: 04/30/2016 12:48 PM   Modules accepted: Orders

## 2016-05-01 LAB — BASIC METABOLIC PANEL WITH GFR
BUN/Creatinine Ratio: 25 — ABNORMAL HIGH (ref 9–23)
BUN: 14 mg/dL (ref 6–20)
CO2: 27 mmol/L (ref 18–29)
Calcium: 9.2 mg/dL (ref 8.7–10.2)
Chloride: 98 mmol/L (ref 96–106)
Creatinine, Ser: 0.56 mg/dL — ABNORMAL LOW (ref 0.57–1.00)
GFR calc Af Amer: 137 mL/min/1.73
GFR calc non Af Amer: 119 mL/min/1.73
Glucose: 82 mg/dL (ref 65–99)
Potassium: 4 mmol/L (ref 3.5–5.2)
Sodium: 141 mmol/L (ref 134–144)

## 2016-05-01 LAB — LIPID PANEL
Chol/HDL Ratio: 4.2 ratio (ref 0.0–4.4)
Cholesterol, Total: 196 mg/dL (ref 100–199)
HDL: 47 mg/dL
LDL Calculated: 121 mg/dL — ABNORMAL HIGH (ref 0–99)
Triglycerides: 142 mg/dL (ref 0–149)
VLDL Cholesterol Cal: 28 mg/dL (ref 5–40)

## 2016-05-01 LAB — TSH: TSH: 0.621 u[IU]/mL (ref 0.450–4.500)

## 2016-05-01 LAB — CBC
Hematocrit: 42.7 % (ref 34.0–46.6)
Hemoglobin: 14 g/dL (ref 11.1–15.9)
MCH: 26.3 pg — ABNORMAL LOW (ref 26.6–33.0)
MCHC: 32.8 g/dL (ref 31.5–35.7)
MCV: 80 fL (ref 79–97)
Platelets: 248 x10E3/uL (ref 150–379)
RBC: 5.33 x10E6/uL — ABNORMAL HIGH (ref 3.77–5.28)
RDW: 14.6 % (ref 12.3–15.4)
WBC: 5.1 x10E3/uL (ref 3.4–10.8)

## 2016-05-03 ENCOUNTER — Telehealth: Payer: Self-pay | Admitting: *Deleted

## 2016-05-03 NOTE — Telephone Encounter (Signed)
Spoke with patient forms ready for pick up

## 2016-07-23 ENCOUNTER — Emergency Department (HOSPITAL_COMMUNITY): Payer: Managed Care, Other (non HMO)

## 2016-07-23 ENCOUNTER — Emergency Department (HOSPITAL_COMMUNITY)
Admission: EM | Admit: 2016-07-23 | Discharge: 2016-07-23 | Disposition: A | Discharge status: Home / Self Care | Payer: Managed Care, Other (non HMO) | Attending: Physician Assistant | Admitting: Physician Assistant

## 2016-07-23 ENCOUNTER — Encounter (HOSPITAL_COMMUNITY): Payer: Self-pay | Admitting: Nurse Practitioner

## 2016-07-23 DIAGNOSIS — J45909 Unspecified asthma, uncomplicated: Secondary | ICD-10-CM | POA: Diagnosis not present

## 2016-07-23 DIAGNOSIS — R51 Headache: Secondary | ICD-10-CM | POA: Diagnosis not present

## 2016-07-23 DIAGNOSIS — Y999 Unspecified external cause status: Secondary | ICD-10-CM | POA: Insufficient documentation

## 2016-07-23 DIAGNOSIS — Y939 Activity, unspecified: Secondary | ICD-10-CM | POA: Diagnosis not present

## 2016-07-23 DIAGNOSIS — Y92410 Unspecified street and highway as the place of occurrence of the external cause: Secondary | ICD-10-CM | POA: Insufficient documentation

## 2016-07-23 DIAGNOSIS — R079 Chest pain, unspecified: Secondary | ICD-10-CM | POA: Insufficient documentation

## 2016-07-23 DIAGNOSIS — S199XXA Unspecified injury of neck, initial encounter: Secondary | ICD-10-CM | POA: Insufficient documentation

## 2016-07-23 DIAGNOSIS — M791 Myalgia: Secondary | ICD-10-CM | POA: Diagnosis not present

## 2016-07-23 DIAGNOSIS — M7918 Myalgia, other site: Secondary | ICD-10-CM

## 2016-07-23 MED ORDER — IOPAMIDOL (ISOVUE-300) INJECTION 61%
INTRAVENOUS | Status: AC
  Administered 2016-07-23: 75 mL
  Filled 2016-07-23: qty 75

## 2016-07-23 MED ORDER — IBUPROFEN 600 MG PO TABS: 600.0000 mg | ORAL_TABLET | Freq: Four times a day (QID) | ORAL | 0 refills | Status: DC | PRN

## 2016-07-23 MED ORDER — CYCLOBENZAPRINE HCL 10 MG PO TABS: 10.0000 mg | ORAL_TABLET | Freq: Three times a day (TID) | ORAL | 0 refills | Status: AC | PRN

## 2016-07-23 MED ORDER — HYDROCODONE-ACETAMINOPHEN 5-325 MG PO TABS
1.0000 | ORAL_TABLET | Freq: Once | ORAL | Status: AC
  Administered 2016-07-23: 1 via ORAL
  Filled 2016-07-23: qty 1

## 2016-07-23 NOTE — ED Notes (Signed)
Pt returned from CT °

## 2016-07-23 NOTE — ED Notes (Signed)
Pt transported to CT ?
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
## 2016-07-23 NOTE — Discharge Instructions (Signed)
Read the information below.  Use the prescribed medication as directed.  Please discuss all new medications with your pharmacist.  You may return to the Emergency Department at any time for worsening condition or any new symptoms that concern you.    °

## 2016-07-23 NOTE — ED Notes (Signed)
Pt waiting for CT scan.
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
## 2016-11-10 ENCOUNTER — Encounter (HOSPITAL_COMMUNITY): Payer: Self-pay | Admitting: Emergency Medicine

## 2016-11-10 ENCOUNTER — Ambulatory Visit (HOSPITAL_COMMUNITY)
Admission: EM | Admit: 2016-11-10 | Discharge: 2016-11-10 | Disposition: A | Discharge status: Home / Self Care | Payer: 59 | Attending: Family Medicine | Admitting: Family Medicine

## 2016-11-10 DIAGNOSIS — R51 Headache: Secondary | ICD-10-CM

## 2016-11-10 DIAGNOSIS — R519 Headache, unspecified: Secondary | ICD-10-CM

## 2016-11-10 DIAGNOSIS — H60503 Unspecified acute noninfective otitis externa, bilateral: Secondary | ICD-10-CM

## 2016-11-10 MED ORDER — CIPROFLOXACIN HCL 500 MG PO TABS: 500.0000 mg | ORAL_TABLET | Freq: Two times a day (BID) | ORAL | 0 refills | Status: DC

## 2016-11-10 MED ORDER — ACETAMINOPHEN 325 MG PO TABS
650.0000 mg | ORAL_TABLET | Freq: Once | ORAL | Status: AC
  Administered 2016-11-10: 650 mg via ORAL

## 2016-11-10 MED ORDER — ACETAMINOPHEN 325 MG PO TABS
ORAL_TABLET | ORAL | Status: AC
  Filled 2016-11-10: qty 1

## 2016-11-10 MED ORDER — NEOMYCIN-POLYMYXIN-HC 3.5-10000-1 OT SUSP: 4.0000 [drp] | Freq: Three times a day (TID) | OTIC | 0 refills | Status: AC

## 2016-11-10 NOTE — Discharge Instructions (Signed)
Return in 24-48 hours as needed for reevaluation, or go to the ER sooner if symptoms worsen. Take tylenol or ibuprofen as needed for fever and for headaches.

## 2016-11-10 NOTE — ED Triage Notes (Signed)
Pt complains of a headache and bilateral ear pain that radiates down into her neck.

## 2016-11-10 NOTE — ED Provider Notes (Signed)
  [facility]   [id number] 11/10/16 Arrival Time: 1307   SUBJECTIVE:  Jessica Donaldson is a 41 y.o. female who presents to the urgent care with complaint of fever, headache, and ear pain for 24 hours. States there is a pressure within her ears, and is radiating down her neck. Denies any nausea, vomiting, no visual disturbances. No cough or shortness of breath, chest pain or palpitations, sore throat, or other symptoms. Has not been swimming, denies fluid in the ears.     Past Medical History:  Diagnosis Date  . Asthma    History reviewed. No pertinent family history. Social History   Social History  . Marital status: Married    Spouse name: N/A  . Number of children: N/A  . Years of education: N/A   Occupational History  . Not on file.   Social History Main Topics  . Smoking status: Never Smoker  . Smokeless tobacco: Never Used  . Alcohol use No  . Drug use: Unknown  . Sexual activity: Not on file   Other Topics Concern  . Not on file   Social History Narrative  . No narrative on file   No outpatient prescriptions have been marked as taking for the 11/10/16 encounter [facility](Hospital Encounter).   No Known Allergies    ROS: As per HPI, remainder of ROS negative.   OBJECTIVE:   Vitals:   11/10/16 1417  BP: (!) 144/87  Pulse: (!) 115  Temp: (!) 100.9 F (38.3 C)  TempSrc: Oral  SpO2: 100%     General appearance: alert; no distress Eyes: PERRL; EOMI; conjunctiva normal HENT: normocephalic; atraumatic; Tenderness pre-and postauricular lymph nodes, and tenderness with palpation of the auricles, auditory canals bilaterally are swollen, with exudate in the canals, tympanic membranes appear intact.  nasal mucosa normal; oral mucosa normal Neck: supple, full range of motion without tenderness, no cervical lymphadenopathy Lungs: clear to auscultation bilaterally Heart: regular rate and rhythm Abdomen: soft, non-tender; bowel sounds normal; no masses or  organomegaly; no guarding or rebound tenderness Back: no CVA tenderness Extremities: no cyanosis or edema; symmetrical with no gross deformities Skin: warm and dry Neurologic: normal gait; grossly normal Psychological: alert and cooperative; normal mood and affect      Labs:  Labs Reviewed - No data to display  No results found.     ASSESSMENT & PLAN:  1. Acute nonintractable headache, unspecified headache type   2. Acute otitis externa of both ears, unspecified type     Meds ordered this encounter  Medications  . acetaminophen (TYLENOL) tablet 650 mg  . ciprofloxacin (CIPRO) 500 MG tablet    Sig: Take 1 tablet (500 mg total) by mouth 2 (two) times daily.    Dispense:  14 tablet    Refill:  0  . neomycin-polymyxin-hydrocortisone (CORTISPORIN) 3.5-10000-1 OTIC suspension    Sig: Place 4 drops into both ears 3 (three) times daily.    Dispense:  10 mL    Refill:  0    Discussed case with attending physician, Dr. Tracie HarrierHagler, agrees with course of treatment, at patient return in 24-48 hours as needed for recheck, return to go to the ER any time symptoms worsen  Reviewed expectations re: course of current medical issues. Questions answered. Outlined signs and symptoms indicating need for more acute intervention. Patient verbalized understanding. After Visit Summary given.    Procedures:        Dorena BodoKennard, Olinda Nola, NP 11/10/16 1506

## 2018-06-04 ENCOUNTER — Other Ambulatory Visit: Payer: Self-pay

## 2018-06-04 DIAGNOSIS — Z0000 Encounter for general adult medical examination without abnormal findings: Secondary | ICD-10-CM

## 2018-06-05 ENCOUNTER — Encounter: Payer: Self-pay | Admitting: Emergency Medicine

## 2018-06-05 ENCOUNTER — Telehealth (INDEPENDENT_AMBULATORY_CARE_PROVIDER_SITE_OTHER): Payer: 59 | Admitting: Emergency Medicine

## 2018-06-05 ENCOUNTER — Ambulatory Visit: Payer: Self-pay

## 2018-06-05 ENCOUNTER — Ambulatory Visit (INDEPENDENT_AMBULATORY_CARE_PROVIDER_SITE_OTHER): Payer: 59 | Admitting: Emergency Medicine

## 2018-06-05 ENCOUNTER — Other Ambulatory Visit: Payer: Self-pay

## 2018-06-05 VITALS — BP 147/90 | HR 94 | Temp 98.9°F | Resp 18 | Ht 65.0 in | Wt 137.6 lb

## 2018-06-05 DIAGNOSIS — Z0000 Encounter for general adult medical examination without abnormal findings: Secondary | ICD-10-CM

## 2018-06-05 DIAGNOSIS — Z1321 Encounter for screening for nutritional disorder: Secondary | ICD-10-CM

## 2018-06-05 DIAGNOSIS — B3731 Acute candidiasis of vulva and vagina: Secondary | ICD-10-CM

## 2018-06-05 DIAGNOSIS — Z13228 Encounter for screening for other metabolic disorders: Secondary | ICD-10-CM

## 2018-06-05 DIAGNOSIS — N898 Other specified noninflammatory disorders of vagina: Secondary | ICD-10-CM

## 2018-06-05 DIAGNOSIS — Z1329 Encounter for screening for other suspected endocrine disorder: Secondary | ICD-10-CM

## 2018-06-05 DIAGNOSIS — B373 Candidiasis of vulva and vagina: Secondary | ICD-10-CM

## 2018-06-05 DIAGNOSIS — Z13220 Encounter for screening for lipoid disorders: Secondary | ICD-10-CM

## 2018-06-05 DIAGNOSIS — Z130 Encounter for screening for diseases of the blood and blood-forming organs and certain disorders involving the immune mechanism: Secondary | ICD-10-CM

## 2018-06-05 LAB — POCT WET + KOH PREP: Trich by wet prep: ABSENT

## 2018-06-05 LAB — POCT URINALYSIS DIP (MANUAL ENTRY)
Bilirubin, UA: NEGATIVE
Blood, UA: NEGATIVE
Glucose, UA: NEGATIVE mg/dL
Leukocytes, UA: NEGATIVE
Nitrite, UA: NEGATIVE
Protein Ur, POC: NEGATIVE mg/dL
Spec Grav, UA: 1.025 (ref 1.010–1.025)
Urobilinogen, UA: 0.2 E.U./dL
pH, UA: 6 (ref 5.0–8.0)

## 2018-06-05 MED ORDER — FLUCONAZOLE 150 MG PO TABS: 150.0000 mg | ORAL_TABLET | Freq: Once | ORAL | 0 refills | Status: AC

## 2018-06-05 NOTE — Progress Notes (Signed)
Pt c/o CPE. Is having so discoloration in face for a long time now would like to discuss.

## 2018-06-05 NOTE — Patient Instructions (Signed)

## 2018-06-05 NOTE — Progress Notes (Signed)
Jessica Donaldson 43 y.o.   No chief complaint on file. Chief complaint: Here for CPE  HISTORY OF PRESENT ILLNESS: This is a 43 y.o. female here today for CPE.  No chronic medical problems on no chronic medications.  Complaining of vaginal itching.  Has no other complaints or medical concerns today.  HPI   Prior to Admission medications   Medication Sig Start Date End Date Taking? Authorizing Provider  cetirizine (ZYRTEC ALLERGY) 10 MG tablet Take 1 tablet (10 mg total) by mouth daily. Patient not taking: Reported on 06/05/2018 04/30/16   Jaynee Eagles, PA-C  cyclobenzaprine (FLEXERIL) 10 MG tablet Take 1 tablet (10 mg total) by mouth 3 (three) times daily as needed for muscle spasms (or pain). Patient not taking: Reported on 06/05/2018 07/23/16   Clayton Bibles, PA-C  ibuprofen (ADVIL,MOTRIN) 600 MG tablet Take 1 tablet (600 mg total) by mouth every 6 (six) hours as needed for mild pain or moderate pain. Patient not taking: Reported on 06/05/2018 07/23/16   Clayton Bibles, PA-C  neomycin-polymyxin-hydrocortisone (CORTISPORIN) 3.5-10000-1 OTIC suspension Place 4 drops into both ears 3 (three) times daily. Patient not taking: Reported on 06/05/2018 11/10/16   Barnet Glasgow, NP  omeprazole (PRILOSEC) 20 MG capsule Take 1 capsule (20 mg total) by mouth daily. Patient not taking: Reported on 06/05/2018 04/30/16   Jaynee Eagles, PA-C  ranitidine (ZANTAC) 150 MG tablet Take 1 tablet (150 mg total) by mouth 2 (two) times daily. Patient not taking: Reported on 06/05/2018 04/30/16   Jaynee Eagles, PA-C    No Known Allergies  There are no active problems to display for this patient.   Past Medical History:  Diagnosis Date  . Asthma     History reviewed. No pertinent surgical history.  Social History   Socioeconomic History  . Marital status: Married    Spouse name: Not on file  . Number of children: Not on file  . Years of education: Not on file  . Highest education level: Not on file  Occupational History  .  Not on file  Social Needs  . Financial resource strain: Not on file  . Food insecurity:    Worry: Not on file    Inability: Not on file  . Transportation needs:    Medical: Not on file    Non-medical: Not on file  Tobacco Use  . Smoking status: Never Smoker  . Smokeless tobacco: Never Used  Substance and Sexual Activity  . Alcohol use: No  . Drug use: Not on file  . Sexual activity: Not on file  Lifestyle  . Physical activity:    Days per week: Not on file    Minutes per session: Not on file  . Stress: Not on file  Relationships  . Social connections:    Talks on phone: Not on file    Gets together: Not on file    Attends religious service: Not on file    Active member of club or organization: Not on file    Attends meetings of clubs or organizations: Not on file    Relationship status: Not on file  . Intimate partner violence:    Fear of current or ex partner: Not on file    Emotionally abused: Not on file    Physically abused: Not on file    Forced sexual activity: Not on file  Other Topics Concern  . Not on file  Social History Narrative  . Not on file    History reviewed. No pertinent family  history.   Review of Systems  Constitutional: Negative.  Negative for chills and fever.  HENT: Negative.  Negative for congestion, hearing loss and sore throat.   Eyes: Negative.   Respiratory: Negative.  Negative for cough and shortness of breath.   Cardiovascular: Negative.  Negative for chest pain and palpitations.  Gastrointestinal: Negative.  Negative for abdominal pain, diarrhea, nausea and vomiting.  Genitourinary: Negative.  Negative for flank pain and hematuria.       Occasional vaginal itching  Musculoskeletal: Negative.   Skin:       Chronic facial discoloration over many years  Neurological: Negative.  Negative for dizziness and headaches.  Endo/Heme/Allergies: Negative.   All other systems reviewed and are negative.  Vitals:   06/05/18 1410  BP: (!)  147/90  Pulse: 94  Resp: 18  Temp: 98.9 F (37.2 C)  SpO2: 98%     Physical Exam Vitals signs reviewed. Exam conducted with a chaperone present.  Constitutional:      Appearance: Normal appearance.  HENT:     Head: Normocephalic and atraumatic.     Nose: Nose normal.     Mouth/Throat:     Mouth: Mucous membranes are moist.     Pharynx: Oropharynx is clear.  Eyes:     Extraocular Movements: Extraocular movements intact.     Conjunctiva/sclera: Conjunctivae normal.     Pupils: Pupils are equal, round, and reactive to light.  Neck:     Musculoskeletal: Normal range of motion and neck supple.  Cardiovascular:     Rate and Rhythm: Normal rate and regular rhythm.     Pulses: Normal pulses.     Heart sounds: Normal heart sounds.  Pulmonary:     Effort: Pulmonary effort is normal.     Breath sounds: Normal breath sounds.  Abdominal:     General: Abdomen is flat. Bowel sounds are normal. There is no distension.     Palpations: Abdomen is soft. There is no mass.     Tenderness: There is no abdominal tenderness. There is no guarding.     Hernia: There is no hernia in the right inguinal area or left inguinal area.  Genitourinary:    General: Normal vulva.     Pubic Area: No rash.      Labia:        Right: No rash or tenderness.        Left: No rash or tenderness.      Vagina: Normal. No erythema, tenderness, bleeding or lesions.     Cervix: No cervical motion tenderness, discharge, lesion, erythema or cervical bleeding.     Uterus: Normal.      Adnexa: Right adnexa normal and left adnexa normal.       Right: No mass.         Left: No mass.    Musculoskeletal: Normal range of motion.  Lymphadenopathy:     Lower Body: No right inguinal adenopathy. No left inguinal adenopathy.  Skin:    General: Skin is warm and dry.     Capillary Refill: Capillary refill takes less than 2 seconds.  Neurological:     General: No focal deficit present.     Mental Status: She is alert and  oriented to person, place, and time.  Psychiatric:        Mood and Affect: Mood normal.        Behavior: Behavior normal.        Thought Content: Thought content normal.  ASSESSMENT & PLAN: Diagnoses and all orders for this visit:  Annual physical exam -     TSH -     HIV Antibody (routine testing w rflx) -     POCT urinalysis dipstick -     CBC -     Lipid panel -     Comprehensive metabolic panel -     Pap IG and HPV (high risk) DNA detection  Vaginal itching -     POCT Wet + KOH Prep -     fluconazole (DIFLUCAN) 150 MG tablet; Take 1 tablet (150 mg total) by mouth once for 1 dose.  Screening for endocrine, nutritional, metabolic and immunity disorder  Screening for deficiency anemia  Screening for lipoid disorders   Patient Instructions  Health Maintenance, Female Adopting a healthy lifestyle and getting preventive care can go a long way to promote health and wellness. Talk with your health care provider about what schedule of regular examinations is right for you. This is a good chance for you to check in with your provider about disease prevention and staying healthy. In between checkups, there are plenty of things you can do on your own. Experts have done a lot of research about which lifestyle changes and preventive measures are most likely to keep you healthy. Ask your health care provider for more information. Weight and diet Eat a healthy diet  Be sure to include plenty of vegetables, fruits, low-fat dairy products, and lean protein.  Do not eat a lot of foods high in solid fats, added sugars, or salt.  Get regular exercise. This is one of the most important things you can do for your health. ? Most adults should exercise for at least 150 minutes each week. The exercise should increase your heart rate and make you sweat (moderate-intensity exercise). ? Most adults should also do strengthening exercises at least twice a week. This is in addition to the  moderate-intensity exercise. Maintain a healthy weight  Body mass index (BMI) is a measurement that can be used to identify possible weight problems. It estimates body fat based on height and weight. Your health care provider can help determine your BMI and help you achieve or maintain a healthy weight.  For females 40 years of age and older: ? A BMI below 18.5 is considered underweight. ? A BMI of 18.5 to 24.9 is normal. ? A BMI of 25 to 29.9 is considered overweight. ? A BMI of 30 and above is considered obese. Watch levels of cholesterol and blood lipids  You should start having your blood tested for lipids and cholesterol at 41 years of age, then have this test every 5 years.  You may need to have your cholesterol levels checked more often if: ? Your lipid or cholesterol levels are high. ? You are older than 41 years of age. ? You are at high risk for heart disease. Cancer screening Lung Cancer  Lung cancer screening is recommended for adults 51-75 years old who are at high risk for lung cancer because of a history of smoking.  A yearly low-dose CT scan of the lungs is recommended for people who: ? Currently smoke. ? Have quit within the past 15 years. ? Have at least a 30-pack-year history of smoking. A pack year is smoking an average of one pack of cigarettes a day for 1 year.  Yearly screening should continue until it has been 15 years since you quit.  Yearly screening should stop if you develop  a health problem that would prevent you from having lung cancer treatment. Breast Cancer  Practice breast self-awareness. This means understanding how your breasts normally appear and feel.  It also means doing regular breast self-exams. Let your health care provider know about any changes, no matter how small.  If you are in your 20s or 30s, you should have a clinical breast exam (CBE) by a health care provider every 1-3 years as part of a regular health exam.  If you are 53 or  older, have a CBE every year. Also consider having a breast X-ray (mammogram) every year.  If you have a family history of breast cancer, talk to your health care provider about genetic screening.  If you are at high risk for breast cancer, talk to your health care provider about having an MRI and a mammogram every year.  Breast cancer gene (BRCA) assessment is recommended for women who have family members with BRCA-related cancers. BRCA-related cancers include: ? Breast. ? Ovarian. ? Tubal. ? Peritoneal cancers.  Results of the assessment will determine the need for genetic counseling and BRCA1 and BRCA2 testing. Cervical Cancer Your health care provider may recommend that you be screened regularly for cancer of the pelvic organs (ovaries, uterus, and vagina). This screening involves a pelvic examination, including checking for microscopic changes to the surface of your cervix (Pap test). You may be encouraged to have this screening done every 3 years, beginning at age 49.  For women ages 29-65, health care providers may recommend pelvic exams and Pap testing every 3 years, or they may recommend the Pap and pelvic exam, combined with testing for human papilloma virus (HPV), every 5 years. Some types of HPV increase your risk of cervical cancer. Testing for HPV may also be done on women of any age with unclear Pap test results.  Other health care providers may not recommend any screening for nonpregnant women who are considered low risk for pelvic cancer and who do not have symptoms. Ask your health care provider if a screening pelvic exam is right for you.  If you have had past treatment for cervical cancer or a condition that could lead to cancer, you need Pap tests and screening for cancer for at least 20 years after your treatment. If Pap tests have been discontinued, your risk factors (such as having a new sexual partner) need to be reassessed to determine if screening should resume. Some  women have medical problems that increase the chance of getting cervical cancer. In these cases, your health care provider may recommend more frequent screening and Pap tests. Colorectal Cancer  This type of cancer can be detected and often prevented.  Routine colorectal cancer screening usually begins at 42 years of age and continues through 41 years of age.  Your health care provider may recommend screening at an earlier age if you have risk factors for colon cancer.  Your health care provider may also recommend using home test kits to check for hidden blood in the stool.  A small camera at the end of a tube can be used to examine your colon directly (sigmoidoscopy or colonoscopy). This is done to check for the earliest forms of colorectal cancer.  Routine screening usually begins at age 22.  Direct examination of the colon should be repeated every 5-10 years through 41 years of age. However, you may need to be screened more often if early forms of precancerous polyps or small growths are found. Skin Cancer  Check  your skin from head to toe regularly.  Tell your health care provider about any new moles or changes in moles, especially if there is a change in a mole's shape or color.  Also tell your health care provider if you have a mole that is larger than the size of a pencil eraser.  Always use sunscreen. Apply sunscreen liberally and repeatedly throughout the day.  Protect yourself by wearing long sleeves, pants, a wide-brimmed hat, and sunglasses whenever you are outside. Heart disease, diabetes, and high blood pressure  High blood pressure causes heart disease and increases the risk of stroke. High blood pressure is more likely to develop in: ? People who have blood pressure in the high end of the normal range (130-139/85-89 mm Hg). ? People who are overweight or obese. ? People who are African American.  If you are 35-68 years of age, have your blood pressure checked every  3-5 years. If you are 30 years of age or older, have your blood pressure checked every year. You should have your blood pressure measured twice-once when you are at a hospital or clinic, and once when you are not at a hospital or clinic. Record the average of the two measurements. To check your blood pressure when you are not at a hospital or clinic, you can use: ? An automated blood pressure machine at a pharmacy. ? A home blood pressure monitor.  If you are between 27 years and 85 years old, ask your health care provider if you should take aspirin to prevent strokes.  Have regular diabetes screenings. This involves taking a blood sample to check your fasting blood sugar level. ? If you are at a normal weight and have a low risk for diabetes, have this test once every three years after 41 years of age. ? If you are overweight and have a high risk for diabetes, consider being tested at a younger age or more often. Preventing infection Hepatitis B  If you have a higher risk for hepatitis B, you should be screened for this virus. You are considered at high risk for hepatitis B if: ? You were born in a country where hepatitis B is common. Ask your health care provider which countries are considered high risk. ? Your parents were born in a high-risk country, and you have not been immunized against hepatitis B (hepatitis B vaccine). ? You have HIV or AIDS. ? You use needles to inject street drugs. ? You live with someone who has hepatitis B. ? You have had sex with someone who has hepatitis B. ? You get hemodialysis treatment. ? You take certain medicines for conditions, including cancer, organ transplantation, and autoimmune conditions. Hepatitis C  Blood testing is recommended for: ? Everyone born from [birth year] through 1963. ? Anyone with known risk factors for hepatitis C. Sexually transmitted infections (STIs)  You should be screened for sexually transmitted infections (STIs) including  gonorrhea and chlamydia if: ? You are sexually active and are younger than 41 years of age. ? You are older than 41 years of age and your health care provider tells you that you are at risk for this type of infection. ? Your sexual activity has changed since you were last screened and you are at an increased risk for chlamydia or gonorrhea. Ask your health care provider if you are at risk.  If you do not have HIV, but are at risk, it may be recommended that you take a prescription medicine daily to prevent  HIV infection. This is called pre-exposure prophylaxis (PrEP). You are considered at risk if: ? You are sexually active and do not regularly use condoms or know the HIV status of your partner(s). ? You take drugs by injection. ? You are sexually active with a partner who has HIV. Talk with your health care provider about whether you are at high risk of being infected with HIV. If you choose to begin PrEP, you should first be tested for HIV. You should then be tested every 3 months for as long as you are taking PrEP. Pregnancy  If you are premenopausal and you may become pregnant, ask your health care provider about preconception counseling.  If you may become pregnant, take 400 to 800 micrograms (mcg) of folic acid every day.  If you want to prevent pregnancy, talk to your health care provider about birth control (contraception). Osteoporosis and menopause  Osteoporosis is a disease in which the bones lose minerals and strength with aging. This can result in serious bone fractures. Your risk for osteoporosis can be identified using a bone density scan.  If you are 32 years of age or older, or if you are at risk for osteoporosis and fractures, ask your health care provider if you should be screened.  Ask your health care provider whether you should take a calcium or vitamin D supplement to lower your risk for osteoporosis.  Menopause may have certain physical symptoms and risks.  Hormone  replacement therapy may reduce some of these symptoms and risks. Talk to your health care provider about whether hormone replacement therapy is right for you. Follow these instructions at home:  Schedule regular health, dental, and eye exams.  Stay current with your immunizations.  Do not use any tobacco products including cigarettes, chewing tobacco, or electronic cigarettes.  If you are pregnant, do not drink alcohol.  If you are breastfeeding, limit how much and how often you drink alcohol.  Limit alcohol intake to no more than 1 drink per day for nonpregnant women. One drink equals 12 ounces of beer, 5 ounces of wine, or 1 ounces of hard liquor.  Do not use street drugs.  Do not share needles.  Ask your health care provider for help if you need support or information about quitting drugs.  Tell your health care provider if you often feel depressed.  Tell your health care provider if you have ever been abused or do not feel safe at home. This information is not intended to replace advice given to you by your health care provider. Make sure you discuss any questions you have with your health care provider. Document Released: 08/30/2010 Document Revised: 07/23/2015 Document Reviewed: 11/18/2014 Elsevier Interactive Patient Education  2019 [employer]      Agustina Caroli, MD [facility]

## 2018-06-06 ENCOUNTER — Encounter: Payer: Self-pay | Admitting: Radiology

## 2018-06-06 ENCOUNTER — Other Ambulatory Visit (HOSPITAL_COMMUNITY)
Admission: RE | Admit: 2018-06-06 | Discharge: 2018-06-06 | Disposition: A | Discharge status: Home / Self Care | Payer: 59 | Source: Ambulatory Visit | Attending: Emergency Medicine | Admitting: Emergency Medicine

## 2018-06-06 DIAGNOSIS — Z0000 Encounter for general adult medical examination without abnormal findings: Secondary | ICD-10-CM | POA: Insufficient documentation

## 2018-06-06 LAB — CBC
Hematocrit: 38.4 % (ref 34.0–46.6)
Hemoglobin: 12.6 g/dL (ref 11.1–15.9)
MCH: 27.6 pg (ref 26.6–33.0)
MCHC: 32.8 g/dL (ref 31.5–35.7)
MCV: 84 fL (ref 79–97)
Platelets: 203 10*3/uL (ref 150–450)
RBC: 4.57 x10E6/uL (ref 3.77–5.28)
RDW: 13.6 % (ref 11.7–15.4)
WBC: 5.1 10*3/uL (ref 3.4–10.8)

## 2018-06-06 LAB — COMPREHENSIVE METABOLIC PANEL
ALT: 30 IU/L (ref 0–32)
AST: 18 IU/L (ref 0–40)
Albumin/Globulin Ratio: 1.5 (ref 1.2–2.2)
Albumin: 4.3 g/dL (ref 3.8–4.8)
Alkaline Phosphatase: 44 IU/L (ref 39–117)
BUN/Creatinine Ratio: 29 — ABNORMAL HIGH (ref 9–23)
BUN: 14 mg/dL (ref 6–24)
Bilirubin Total: 0.2 mg/dL (ref 0.0–1.2)
CO2: 24 mmol/L (ref 20–29)
Calcium: 8.8 mg/dL (ref 8.7–10.2)
Chloride: 104 mmol/L (ref 96–106)
Creatinine, Ser: 0.49 mg/dL — ABNORMAL LOW (ref 0.57–1.00)
GFR calc Af Amer: 141 mL/min/{1.73_m2} (ref >59)
GFR calc non Af Amer: 122 mL/min/{1.73_m2} (ref >59)
Globulin, Total: 2.8 g/dL (ref 1.5–4.5)
Glucose: 103 mg/dL — ABNORMAL HIGH (ref 65–99)
Potassium: 4 mmol/L (ref 3.5–5.2)
Sodium: 143 mmol/L (ref 134–144)
Total Protein: 7.1 g/dL (ref 6.0–8.5)

## 2018-06-06 LAB — LIPID PANEL
Chol/HDL Ratio: 5.5 ratio — ABNORMAL HIGH (ref 0.0–4.4)
Cholesterol, Total: 193 mg/dL (ref 100–199)
HDL: 35 mg/dL — ABNORMAL LOW (ref >39)
LDL Calculated: 79 mg/dL (ref 0–99)
Triglycerides: 397 mg/dL — ABNORMAL HIGH (ref 0–149)
VLDL Cholesterol Cal: 79 mg/dL — ABNORMAL HIGH (ref 5–40)

## 2018-06-06 LAB — HM PAP SMEAR: HM Pap smear: NEGATIVE

## 2018-06-06 LAB — TSH: TSH: 1.18 u[IU]/mL (ref 0.450–4.500)

## 2018-06-06 LAB — HIV ANTIBODY (ROUTINE TESTING W REFLEX): HIV Screen 4th Generation wRfx: NONREACTIVE

## 2018-06-08 LAB — CYTOLOGY - PAP: Diagnosis: NEGATIVE

## 2018-06-10 NOTE — Addendum Note (Signed)
Addended by: Evie Lacks on: 06/10/2018 07:45 PM   Modules accepted: Level of Service

## 2018-06-12 ENCOUNTER — Telehealth: Payer: Self-pay | Admitting: *Deleted

## 2018-06-12 NOTE — Telephone Encounter (Signed)
Faxed screening collection form ATTN: Screening Services. Confirmation page received at 5:10 pm. 

## 2018-06-21 ENCOUNTER — Encounter: Payer: Self-pay | Admitting: *Deleted

## 2018-08-08 ENCOUNTER — Other Ambulatory Visit: Payer: Self-pay | Admitting: Pediatric Intensive Care

## 2018-08-08 DIAGNOSIS — Z20822 Contact with and (suspected) exposure to covid-19: Secondary | ICD-10-CM

## 2018-08-09 LAB — NOVEL CORONAVIRUS, NAA: SARS-CoV-2, NAA: NOT DETECTED
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
## 2019-05-02 ENCOUNTER — Other Ambulatory Visit: Payer: Self-pay

## 2019-05-02 ENCOUNTER — Ambulatory Visit (INDEPENDENT_AMBULATORY_CARE_PROVIDER_SITE_OTHER): Payer: 59 | Admitting: Emergency Medicine

## 2019-05-02 ENCOUNTER — Encounter: Payer: Self-pay | Admitting: Emergency Medicine

## 2019-05-02 VITALS — BP 157/85 | HR 79 | Temp 98.3°F | Resp 16 | Ht 61.0 in | Wt 137.0 lb

## 2019-05-02 DIAGNOSIS — Z0001 Encounter for general adult medical examination with abnormal findings: Secondary | ICD-10-CM

## 2019-05-02 DIAGNOSIS — R21 Rash and other nonspecific skin eruption: Secondary | ICD-10-CM | POA: Diagnosis not present

## 2019-05-02 DIAGNOSIS — Z13228 Encounter for screening for other metabolic disorders: Secondary | ICD-10-CM

## 2019-05-02 DIAGNOSIS — R109 Unspecified abdominal pain: Secondary | ICD-10-CM

## 2019-05-02 DIAGNOSIS — Z1329 Encounter for screening for other suspected endocrine disorder: Secondary | ICD-10-CM

## 2019-05-02 DIAGNOSIS — Z13220 Encounter for screening for lipoid disorders: Secondary | ICD-10-CM

## 2019-05-02 DIAGNOSIS — Z130 Encounter for screening for diseases of the blood and blood-forming organs and certain disorders involving the immune mechanism: Secondary | ICD-10-CM | POA: Diagnosis not present

## 2019-05-02 DIAGNOSIS — Z0000 Encounter for general adult medical examination without abnormal findings: Secondary | ICD-10-CM

## 2019-05-02 LAB — POCT URINALYSIS DIP (MANUAL ENTRY)
Bilirubin, UA: NEGATIVE
Blood, UA: NEGATIVE
Glucose, UA: NEGATIVE mg/dL
Ketones, POC UA: NEGATIVE mg/dL
Leukocytes, UA: NEGATIVE
Nitrite, UA: NEGATIVE
Protein Ur, POC: NEGATIVE mg/dL
Spec Grav, UA: 1.025 (ref 1.010–1.025)
Urobilinogen, UA: 0.2 E.U./dL
pH, UA: 6 (ref 5.0–8.0)

## 2019-05-02 LAB — POCT URINE PREGNANCY: Preg Test, Ur: NEGATIVE

## 2019-05-02 NOTE — Progress Notes (Signed)
Jessica Donaldson 44 y.o.   Chief Complaint  Patient presents with  . Annual Exam    HISTORY OF PRESENT ILLNESS: This is a 44 y.o. female here for annual exam. Healthy female with a healthy lifestyle. Complaining of intermittent mostly lower abdominal pain for the past year along with itchy rash for the same amount of time.  Intermittent symptoms associated with no other significant symptoms. History obtained with the help of an interpreter. Non-smoker and no EtOH user. Health maintenance items reviewed with patient.  HPI   Prior to Admission medications   Medication Sig Start Date End Date Taking? Authorizing Provider  cetirizine (ZYRTEC ALLERGY) 10 MG tablet Take 1 tablet (10 mg total) by mouth daily. 04/30/16  Yes Wallis Bamberg, PA-C  cyclobenzaprine (FLEXERIL) 10 MG tablet Take 1 tablet (10 mg total) by mouth 3 (three) times daily as needed for muscle spasms (or pain). 07/23/16  Yes West, Emily, PA-C  ibuprofen (ADVIL,MOTRIN) 600 MG tablet Take 1 tablet (600 mg total) by mouth every 6 (six) hours as needed for mild pain or moderate pain. 07/23/16  Yes West, Emily, PA-C  neomycin-polymyxin-hydrocortisone (CORTISPORIN) 3.5-10000-1 OTIC suspension Place 4 drops into both ears 3 (three) times daily. 11/10/16  Yes Dorena Bodo, NP  omeprazole (PRILOSEC) 20 MG capsule Take 1 capsule (20 mg total) by mouth daily. 04/30/16  Yes Wallis Bamberg, PA-C  ranitidine (ZANTAC) 150 MG tablet Take 1 tablet (150 mg total) by mouth 2 (two) times daily. 04/30/16  Yes Wallis Bamberg, PA-C    Allergies  Allergen Reactions  . Poultry Meal Itching    Chicken and fish sauce    There are no problems to display for this patient.   Past Medical History:  Diagnosis Date  . Asthma     History reviewed. No pertinent surgical history.  Social History   Socioeconomic History  . Marital status: Married    Spouse name: Not on file  . Number of children: Not on file  . Years of education: Not on file  . Highest  education level: Not on file  Occupational History  . Not on file  Tobacco Use  . Smoking status: Never Smoker  . Smokeless tobacco: Never Used  Substance and Sexual Activity  . Alcohol use: No  . Drug use: Not on file  . Sexual activity: Not on file  Other Topics Concern  . Not on file  Social History Narrative  . Not on file   Social Determinants of Health   Financial Resource Strain:   . Difficulty of Paying Living Expenses: Not on file  Food Insecurity:   . Worried About Programme researcher, broadcasting/film/video in the Last Year: Not on file  . Ran Out of Food in the Last Year: Not on file  Transportation Needs:   . Lack of Transportation (Medical): Not on file  . Lack of Transportation (Non-Medical): Not on file  Physical Activity:   . Days of Exercise per Week: Not on file  . Minutes of Exercise per Session: Not on file  Stress:   . Feeling of Stress : Not on file  Social Connections:   . Frequency of Communication with Friends and Family: Not on file  . Frequency of Social Gatherings with Friends and Family: Not on file  . Attends Religious Services: Not on file  . Active Member of Clubs or Organizations: Not on file  . Attends Banker Meetings: Not on file  . Marital Status: Not on file  Intimate  Partner Violence:   . Fear of Current or Ex-Partner: Not on file  . Emotionally Abused: Not on file  . Physically Abused: Not on file  . Sexually Abused: Not on file    History reviewed. No pertinent family history.   Review of Systems  Constitutional: Negative.  Negative for chills and fever.  HENT: Negative.  Negative for congestion and sore throat.   Eyes: Negative.  Negative for blurred vision and double vision.  Respiratory: Negative.  Negative for cough and shortness of breath.   Cardiovascular: Negative.  Negative for chest pain and palpitations.  Gastrointestinal: Positive for abdominal pain. Negative for blood in stool, diarrhea, melena, nausea and vomiting.    Genitourinary: Negative.  Negative for dysuria and hematuria.  Musculoskeletal: Negative.  Negative for back pain, myalgias and neck pain.  Skin: Positive for itching and rash.  Neurological: Negative.  Negative for dizziness and headaches.  Endo/Heme/Allergies: Negative.   All other systems reviewed and are negative.  Vitals:   05/02/19 1331  BP: (!) 157/85  Pulse: 79  Resp: 16  Temp: 98.3 F (36.8 C)  SpO2: 97%     Physical Exam Vitals reviewed.  Constitutional:      Appearance: Normal appearance.  HENT:     Head: Normocephalic.     Mouth/Throat:     Mouth: Mucous membranes are moist.     Pharynx: Oropharynx is clear.  Eyes:     Extraocular Movements: Extraocular movements intact.     Conjunctiva/sclera: Conjunctivae normal.     Pupils: Pupils are equal, round, and reactive to light.  Cardiovascular:     Rate and Rhythm: Normal rate and regular rhythm.     Pulses: Normal pulses.     Heart sounds: Normal heart sounds.  Pulmonary:     Effort: Pulmonary effort is normal.     Breath sounds: Normal breath sounds.  Abdominal:     General: Bowel sounds are normal. There is no distension.     Palpations: Abdomen is soft.     Tenderness: There is no abdominal tenderness.  Musculoskeletal:        General: Normal range of motion.     Cervical back: Normal range of motion and neck supple.     Right lower leg: No edema.     Left lower leg: No edema.  Skin:    General: Skin is warm and dry.     Capillary Refill: Capillary refill takes less than 2 seconds.  Neurological:     General: No focal deficit present.     Mental Status: She is alert and oriented to person, place, and time.  Psychiatric:        Mood and Affect: Mood normal.        Behavior: Behavior normal.    Results for orders placed or performed in visit on 05/02/19 (from the past 24 hour(s))  POCT urinalysis dipstick     Status: None   Collection Time: 05/02/19  2:13 PM  Result Value Ref Range   Color, UA  yellow yellow   Clarity, UA clear clear   Glucose, UA negative negative mg/dL   Bilirubin, UA negative negative   Ketones, POC UA negative negative mg/dL   Spec Grav, UA 1.638 4.536 - 1.025   Blood, UA negative negative   pH, UA 6.0 5.0 - 8.0   Protein Ur, POC negative negative mg/dL   Urobilinogen, UA 0.2 0.2 or 1.0 E.U./dL   Nitrite, UA Negative Negative   Leukocytes, UA  Negative Negative  POCT urine pregnancy     Status: None   Collection Time: 05/02/19  2:14 PM  Result Value Ref Range   Preg Test, Ur Negative Negative     ASSESSMENT & PLAN: Nara was seen today for annual exam.  Diagnoses and all orders for this visit:  Routine general medical examination at a health care facility  Abdominal pain, unspecified abdominal location -     CBC with Differential/Platelet -     Comprehensive metabolic panel -     POCT urinalysis dipstick -     POCT urine pregnancy  Screening for deficiency anemia  Screening for lipoid disorders -     Lipid panel  Screening for endocrine, metabolic and immunity disorder -     Comprehensive metabolic panel -     Hemoglobin A1c  Rash and nonspecific skin eruption    Patient Instructions       If you have lab work done today you will be contacted with your lab results within the next 2 weeks.  If you have not heard from [place] then please contact us. The fastest way to get your results is to register for My Chart.   IF you received an x-ray today, you will receive an invoice from [facility]. Please contact [facility] at [phone] with questions or concerns regarding your invoice.   IF you received labwork today, you will receive an invoice from [facility]. Please contact [facility] at [phone] with questions or concerns regarding your invoice.   Our billing staff will not be able to assist you with questions regarding bills from these companies.  You will be contacted with the lab results as soon as they are  available. The fastest way to get your results is to activate your My Chart account. Instructions are located on the last page of this paperwork. If you have not heard from [place] regarding the results in 2 weeks, please contact this office.      Health Maintenance, Female Adopting a healthy lifestyle and getting preventive care are important in promoting health and wellness. Ask your health care provider about:  The right schedule for you to have regular tests and exams.  Things you can do on your own to prevent diseases and keep yourself healthy. What should I know about diet, weight, and exercise? Eat a healthy diet   Eat a diet that includes plenty of vegetables, fruits, low-fat dairy products, and lean protein.  Do not eat a lot of foods that are high in solid fats, added sugars, or sodium. Maintain a healthy weight Body mass index (BMI) is used to identify weight problems. It estimates body fat based on height and weight. Your health care provider can help determine your BMI and help you achieve or maintain a healthy weight. Get regular exercise Get regular exercise. This is one of the most important things you can do for your health. Most adults should:  Exercise for at least 150 minutes each week. The exercise should increase your heart rate and make you sweat (moderate-intensity exercise).  Do strengthening exercises at least twice a week. This is in addition to the moderate-intensity exercise.  Spend less time sitting. Even light physical activity can be beneficial. Watch cholesterol and blood lipids Have your blood tested for lipids and cholesterol at 42 years of age, then have this test every 5 years. Have your cholesterol levels checked more often if:  Your lipid or cholesterol levels are high.  You are older than  42 years of age.  You are at high risk for heart disease. What should I know about cancer screening? Depending on your health history and family history, you  may need to have cancer screening at various ages. This may include screening for:  Breast cancer.  Cervical cancer.  Colorectal cancer.  Skin cancer.  Lung cancer. What should I know about heart disease, diabetes, and high blood pressure? Blood pressure and heart disease  High blood pressure causes heart disease and increases the risk of stroke. This is more likely to develop in people who have high blood pressure readings, are of African descent, or are overweight.  Have your blood pressure checked: ? Every 3-5 years if you are 45-73 years of age. ? Every year if you are 76 years old or older. Diabetes Have regular diabetes screenings. This checks your fasting blood sugar level. Have the screening done:  Once every three years after age 18 if you are at a normal weight and have a low risk for diabetes.  More often and at a younger age if you are overweight or have a high risk for diabetes. What should I know about preventing infection? Hepatitis B If you have a higher risk for hepatitis B, you should be screened for this virus. Talk with your health care provider to find out if you are at risk for hepatitis B infection. Hepatitis C Testing is recommended for:  Everyone born from [birth year] through 1963.  Anyone with known risk factors for hepatitis C. Sexually transmitted infections (STIs)  Get screened for STIs, including gonorrhea and chlamydia, if: ? You are sexually active and are younger than 42 years of age. ? You are older than 42 years of age and your health care provider tells you that you are at risk for this type of infection. ? Your sexual activity has changed since you were last screened, and you are at increased risk for chlamydia or gonorrhea. Ask your health care provider if you are at risk.  Ask your health care provider about whether you are at high risk for HIV. Your health care provider may recommend a prescription medicine to help prevent HIV infection. If  you choose to take medicine to prevent HIV, you should first get tested for HIV. You should then be tested every 3 months for as long as you are taking the medicine. Pregnancy  If you are about to stop having your period (premenopausal) and you may become pregnant, seek counseling before you get pregnant.  Take 400 to 800 micrograms (mcg) of folic acid every day if you become pregnant.  Ask for birth control (contraception) if you want to prevent pregnancy. Osteoporosis and menopause Osteoporosis is a disease in which the bones lose minerals and strength with aging. This can result in bone fractures. If you are 60 years old or older, or if you are at risk for osteoporosis and fractures, ask your health care provider if you should:  Be screened for bone loss.  Take a calcium or vitamin D supplement to lower your risk of fractures.  Be given hormone replacement therapy (HRT) to treat symptoms of menopause. Follow these instructions at home: Lifestyle  Do not use any products that contain nicotine or tobacco, such as cigarettes, e-cigarettes, and chewing tobacco. If you need help quitting, ask your health care provider.  Do not use street drugs.  Do not share needles.  Ask your health care provider for help if you need support or information about quitting  drugs. Alcohol use  Do not drink alcohol if: ? Your health care provider tells you not to drink. ? You are pregnant, may be pregnant, or are planning to become pregnant.  If you drink alcohol: ? Limit how much you use to 0-1 drink a day. ? Limit intake if you are breastfeeding.  Be aware of how much alcohol is in your drink. In the [place] one drink equals one 12 oz bottle of beer (355 mL), one 5 oz glass of wine (148 mL), or one 1 oz glass of hard liquor (44 mL). General instructions  Schedule regular health, dental, and eye exams.  Stay current with your vaccines.  Tell your health care provider if: ? You often feel  depressed. ? You have ever been abused or do not feel safe at home. Summary  Adopting a healthy lifestyle and getting preventive care are important in promoting health and wellness.  Follow your health care provider's instructions about healthy diet, exercising, and getting tested or screened for diseases.  Follow your health care provider's instructions on monitoring your cholesterol and blood pressure. This information is not intended to replace advice given to you by your health care provider. Make sure you discuss any questions you have with your health care provider. Document Revised: 02/07/2018 Document Reviewed: 02/07/2018 Elsevier Patient Education  2020 Elsevier Inc.      Agustina Caroli, MD [facility]

## 2019-05-02 NOTE — Patient Instructions (Addendum)
   If you have lab work done today you will be contacted with your lab results within the next 2 weeks.  If you have not heard from us then please contact us. The fastest way to get your results is to register for My Chart.   IF you received an x-ray today, you will receive an invoice from [facility]. Please contact [facility] at [phone] with questions or concerns regarding your invoice.   IF you received labwork today, you will receive an invoice from [facility]. Please contact [facility] at [phone] with questions or concerns regarding your invoice.   Our billing staff will not be able to assist you with questions regarding bills from these companies.  You will be contacted with the lab results as soon as they are available. The fastest way to get your results is to activate your My Chart account. Instructions are located on the last page of this paperwork. If you have not heard from us regarding the results in 2 weeks, please contact this office.       Health Maintenance, Female Adopting a healthy lifestyle and getting preventive care are important in promoting health and wellness. Ask your health care provider about:  The right schedule for you to have regular tests and exams.  Things you can do on your own to prevent diseases and keep yourself healthy. What should I know about diet, weight, and exercise? Eat a healthy diet   Eat a diet that includes plenty of vegetables, fruits, low-fat dairy products, and lean protein.  Do not eat a lot of foods that are high in solid fats, added sugars, or sodium. Maintain a healthy weight Body mass index (BMI) is used to identify weight problems. It estimates body fat based on height and weight. Your health care provider can help determine your BMI and help you achieve or maintain a healthy weight. Get regular exercise Get regular exercise. This is one of the most important things you can do for your health. Most  adults should:  Exercise for at least 150 minutes each week. The exercise should increase your heart rate and make you sweat (moderate-intensity exercise).  Do strengthening exercises at least twice a week. This is in addition to the moderate-intensity exercise.  Spend less time sitting. Even light physical activity can be beneficial. Watch cholesterol and blood lipids Have your blood tested for lipids and cholesterol at 42 years of age, then have this test every 5 years. Have your cholesterol levels checked more often if:  Your lipid or cholesterol levels are high.  You are older than 42 years of age.  You are at high risk for heart disease. What should I know about cancer screening? Depending on your health history and family history, you may need to have cancer screening at various ages. This may include screening for:  Breast cancer.  Cervical cancer.  Colorectal cancer.  Skin cancer.  Lung cancer. What should I know about heart disease, diabetes, and high blood pressure? Blood pressure and heart disease  High blood pressure causes heart disease and increases the risk of stroke. This is more likely to develop in people who have high blood pressure readings, are of African descent, or are overweight.  Have your blood pressure checked: ? Every 3-5 years if you are 18-39 years of age. ? Every year if you are 40 years old or older. Diabetes Have regular diabetes screenings. This checks your fasting blood sugar level. Have the screening done:  Once   every three years after age 40 if you are at a normal weight and have a low risk for diabetes.  More often and at a younger age if you are overweight or have a high risk for diabetes. What should I know about preventing infection? Hepatitis B If you have a higher risk for hepatitis B, you should be screened for this virus. Talk with your health care provider to find out if you are at risk for hepatitis B infection. Hepatitis  C Testing is recommended for:  Everyone born from 1945 through 1965.  Anyone with known risk factors for hepatitis C. Sexually transmitted infections (STIs)  Get screened for STIs, including gonorrhea and chlamydia, if: ? You are sexually active and are younger than 42 years of age. ? You are older than 42 years of age and your health care provider tells you that you are at risk for this type of infection. ? Your sexual activity has changed since you were last screened, and you are at increased risk for chlamydia or gonorrhea. Ask your health care provider if you are at risk.  Ask your health care provider about whether you are at high risk for HIV. Your health care provider may recommend a prescription medicine to help prevent HIV infection. If you choose to take medicine to prevent HIV, you should first get tested for HIV. You should then be tested every 3 months for as long as you are taking the medicine. Pregnancy  If you are about to stop having your period (premenopausal) and you may become pregnant, seek counseling before you get pregnant.  Take 400 to 800 micrograms (mcg) of folic acid every day if you become pregnant.  Ask for birth control (contraception) if you want to prevent pregnancy. Osteoporosis and menopause Osteoporosis is a disease in which the bones lose minerals and strength with aging. This can result in bone fractures. If you are 65 years old or older, or if you are at risk for osteoporosis and fractures, ask your health care provider if you should:  Be screened for bone loss.  Take a calcium or vitamin D supplement to lower your risk of fractures.  Be given hormone replacement therapy (HRT) to treat symptoms of menopause. Follow these instructions at home: Lifestyle  Do not use any products that contain nicotine or tobacco, such as cigarettes, e-cigarettes, and chewing tobacco. If you need help quitting, ask your health care provider.  Do not use street  drugs.  Do not share needles.  Ask your health care provider for help if you need support or information about quitting drugs. Alcohol use  Do not drink alcohol if: ? Your health care provider tells you not to drink. ? You are pregnant, may be pregnant, or are planning to become pregnant.  If you drink alcohol: ? Limit how much you use to 0-1 drink a day. ? Limit intake if you are breastfeeding.  Be aware of how much alcohol is in your drink. In the [place] one drink equals one 12 oz bottle of beer (355 mL), one 5 oz glass of wine (148 mL), or one 1 oz glass of hard liquor (44 mL). General instructions  Schedule regular health, dental, and eye exams.  Stay current with your vaccines.  Tell your health care provider if: ? You often feel depressed. ? You have ever been abused or do not feel safe at home. Summary  Adopting a healthy lifestyle and getting preventive care are important in promoting health and   wellness.  Follow your health care provider's instructions about healthy diet, exercising, and getting tested or screened for diseases.  Follow your health care provider's instructions on monitoring your cholesterol and blood pressure. This information is not intended to replace advice given to you by your health care provider. Make sure you discuss any questions you have with your health care provider. Document Revised: 02/07/2018 Document Reviewed: 02/07/2018 Elsevier Patient Education  2020 [employer] Inc.  

## 2019-05-03 LAB — LIPID PANEL
Chol/HDL Ratio: 4.1 ratio (ref 0.0–4.4)
Cholesterol, Total: 195 mg/dL (ref 100–199)
HDL: 48 mg/dL (ref >39)
LDL Chol Calc (NIH): 104 mg/dL — ABNORMAL HIGH (ref 0–99)
Triglycerides: 250 mg/dL — ABNORMAL HIGH (ref 0–149)
VLDL Cholesterol Cal: 43 mg/dL — ABNORMAL HIGH (ref 5–40)

## 2019-05-03 LAB — CBC WITH DIFFERENTIAL/PLATELET
Basophils Absolute: 0 10*3/uL (ref 0.0–0.2)
Basos: 0 %
EOS (ABSOLUTE): 0.1 10*3/uL (ref 0.0–0.4)
Eos: 1 %
Hematocrit: 40.4 % (ref 34.0–46.6)
Hemoglobin: 13.7 g/dL (ref 11.1–15.9)
Immature Grans (Abs): 0 10*3/uL (ref 0.0–0.1)
Immature Granulocytes: 0 %
Lymphocytes Absolute: 1.7 10*3/uL (ref 0.7–3.1)
Lymphs: 21 %
MCH: 28.4 pg (ref 26.6–33.0)
MCHC: 33.9 g/dL (ref 31.5–35.7)
MCV: 84 fL (ref 79–97)
Monocytes Absolute: 0.3 10*3/uL (ref 0.1–0.9)
Monocytes: 3 %
Neutrophils Absolute: 6.3 10*3/uL (ref 1.4–7.0)
Neutrophils: 75 %
Platelets: 181 10*3/uL (ref 150–450)
RBC: 4.83 x10E6/uL (ref 3.77–5.28)
RDW: 13.6 % (ref 11.7–15.4)
WBC: 8.4 10*3/uL (ref 3.4–10.8)

## 2019-05-03 LAB — COMPREHENSIVE METABOLIC PANEL
ALT: 32 IU/L (ref 0–32)
AST: 23 IU/L (ref 0–40)
Albumin/Globulin Ratio: 1.6 (ref 1.2–2.2)
Albumin: 4.6 g/dL (ref 3.8–4.8)
Alkaline Phosphatase: 59 IU/L (ref 39–117)
BUN/Creatinine Ratio: 15 (ref 9–23)
BUN: 9 mg/dL (ref 6–24)
Bilirubin Total: 0.6 mg/dL (ref 0.0–1.2)
CO2: 23 mmol/L (ref 20–29)
Calcium: 8.9 mg/dL (ref 8.7–10.2)
Chloride: 100 mmol/L (ref 96–106)
Creatinine, Ser: 0.59 mg/dL (ref 0.57–1.00)
GFR calc Af Amer: 132 mL/min/{1.73_m2} (ref >59)
GFR calc non Af Amer: 114 mL/min/{1.73_m2} (ref >59)
Globulin, Total: 2.9 g/dL (ref 1.5–4.5)
Glucose: 80 mg/dL (ref 65–99)
Potassium: 4 mmol/L (ref 3.5–5.2)
Sodium: 139 mmol/L (ref 134–144)
Total Protein: 7.5 g/dL (ref 6.0–8.5)

## 2019-05-03 LAB — HEMOGLOBIN A1C
Est. average glucose Bld gHb Est-mCnc: 114 mg/dL
Hgb A1c MFr Bld: 5.6 % (ref 4.8–5.6)

## 2021-11-25 DIAGNOSIS — R519 Headache, unspecified: Secondary | ICD-10-CM | POA: Diagnosis not present

## 2021-11-25 DIAGNOSIS — B3731 Acute candidiasis of vulva and vagina: Secondary | ICD-10-CM | POA: Diagnosis not present

## 2021-11-25 DIAGNOSIS — K219 Gastro-esophageal reflux disease without esophagitis: Secondary | ICD-10-CM | POA: Diagnosis not present

## 2021-12-27 DIAGNOSIS — E785 Hyperlipidemia, unspecified: Secondary | ICD-10-CM | POA: Diagnosis not present

## 2021-12-27 DIAGNOSIS — Z6826 Body mass index (BMI) 26.0-26.9, adult: Secondary | ICD-10-CM | POA: Diagnosis not present

## 2021-12-27 DIAGNOSIS — G44209 Tension-type headache, unspecified, not intractable: Secondary | ICD-10-CM | POA: Diagnosis not present

## 2022-04-13 DIAGNOSIS — E785 Hyperlipidemia, unspecified: Secondary | ICD-10-CM | POA: Diagnosis not present

## 2022-04-13 DIAGNOSIS — G44209 Tension-type headache, unspecified, not intractable: Secondary | ICD-10-CM | POA: Diagnosis not present

## 2022-04-13 DIAGNOSIS — R5383 Other fatigue: Secondary | ICD-10-CM | POA: Diagnosis not present

## 2022-04-13 DIAGNOSIS — E7800 Pure hypercholesterolemia, unspecified: Secondary | ICD-10-CM | POA: Diagnosis not present

## 2022-04-13 DIAGNOSIS — Z6826 Body mass index (BMI) 26.0-26.9, adult: Secondary | ICD-10-CM | POA: Diagnosis not present

## 2022-04-13 DIAGNOSIS — Z79899 Other long term (current) drug therapy: Secondary | ICD-10-CM | POA: Diagnosis not present

## 2022-04-13 DIAGNOSIS — B3731 Acute candidiasis of vulva and vagina: Secondary | ICD-10-CM | POA: Diagnosis not present

## 2022-04-13 DIAGNOSIS — Z131 Encounter for screening for diabetes mellitus: Secondary | ICD-10-CM | POA: Diagnosis not present

## 2023-03-30 DIAGNOSIS — N898 Other specified noninflammatory disorders of vagina: Secondary | ICD-10-CM | POA: Diagnosis not present

## 2023-03-30 DIAGNOSIS — K5909 Other constipation: Secondary | ICD-10-CM | POA: Diagnosis not present

## 2023-03-30 DIAGNOSIS — N926 Irregular menstruation, unspecified: Secondary | ICD-10-CM | POA: Diagnosis not present

## 2023-03-30 DIAGNOSIS — R102 Pelvic and perineal pain: Secondary | ICD-10-CM | POA: Diagnosis not present

## 2023-06-20 ENCOUNTER — Ambulatory Visit (HOSPITAL_COMMUNITY)
Admission: EM | Admit: 2023-06-20 | Discharge: 2023-06-20 | Disposition: A | Discharge status: Home / Self Care | Payer: PRIVATE HEALTH INSURANCE | Attending: Neurology | Admitting: Neurology

## 2023-06-20 ENCOUNTER — Encounter (HOSPITAL_COMMUNITY): Payer: Self-pay | Admitting: *Deleted

## 2023-06-20 DIAGNOSIS — N898 Other specified noninflammatory disorders of vagina: Secondary | ICD-10-CM

## 2023-06-20 LAB — POCT URINALYSIS DIP (MANUAL ENTRY)
Bilirubin, UA: NEGATIVE
Blood, UA: NEGATIVE
Glucose, UA: NEGATIVE mg/dL
Ketones, POC UA: NEGATIVE mg/dL
Leukocytes, UA: NEGATIVE
Nitrite, UA: NEGATIVE
Protein Ur, POC: NEGATIVE mg/dL
Spec Grav, UA: 1.025
Urobilinogen, UA: 0.2 U/dL
pH, UA: 6

## 2023-06-20 MED ORDER — FLUCONAZOLE 150 MG PO TABS: 150.0000 mg | ORAL_TABLET | ORAL | 0 refills | Status: AC | PRN

## 2023-06-20 NOTE — ED Provider Notes (Signed)
 [facility]    CSN: [id number] Arrival date & time: 06/20/23  1038      History   Chief Complaint Chief Complaint  Patient presents with   Abdominal Pain   Vaginal Itching   Vaginal Discharge    HPI Jessica Donaldson is a 48 y.o. female.   Patient presents today with a family member.  She has had abdominal pain and vaginal discharge starting 2 months ago.  She was having painful urination however her UA was negative.  She is sexually active, but she is not having any pain with sexual activity.  She describes her vaginal discharge is white and thick.  She does not recall having a yeast infection in the past.  On chart review it does appear that she had one approximately 4 years ago.  She is not currently established with a gynecologist.  She does have a PCP who does her annual care.  She is interested in establishing with gynecology.  The history is provided by the patient and a relative. The history is limited by a language barrier.  Abdominal Pain Associated symptoms: vaginal discharge   Vaginal Itching Associated symptoms include abdominal pain.  Vaginal Discharge Associated symptoms: abdominal pain and vaginal itching     Past Medical History:  Diagnosis Date   Allergies    Asthma     There are no active problems to display for this patient.   History reviewed. No pertinent surgical history.  OB History   No obstetric history on file.      Home Medications    Prior to Admission medications   Medication Sig Start Date End Date Taking? Authorizing Provider  fluconazole  (DIFLUCAN ) 150 MG tablet Take 1 tablet (150 mg total) by mouth every three (3) days as needed. 06/20/23  Yes Demetris Capell, Sim Dryer, NP  cetirizine  (ZYRTEC  ALLERGY) 10 MG tablet Take 1 tablet (10 mg total) by mouth daily. 04/30/16   Adolph Hoop, PA-C  cyclobenzaprine  (FLEXERIL ) 10 MG tablet Take 1 tablet (10 mg total) by mouth 3 (three) times daily as needed for muscle spasms (or pain). 07/23/16   Clance Crigler, PA-C  ibuprofen  (ADVIL ,MOTRIN ) 600 MG tablet Take 1 tablet (600 mg total) by mouth every 6 (six) hours as needed for mild pain or moderate pain. 07/23/16   Clance Crigler, PA-C  neomycin -polymyxin-hydrocortisone (CORTISPORIN) 3.5-10000-1 OTIC suspension Place 4 drops into both ears 3 (three) times daily. 11/10/16   Sherrell Dodrill, NP  omeprazole  (PRILOSEC) 20 MG capsule Take 1 capsule (20 mg total) by mouth daily. 04/30/16   Adolph Hoop, PA-C  ranitidine  (ZANTAC ) 150 MG tablet Take 1 tablet (150 mg total) by mouth 2 (two) times daily. 04/30/16   Adolph Hoop, PA-C    Family History History reviewed. No pertinent family history.  Social History Social History   Tobacco Use   Smoking status: Never   Smokeless tobacco: Never  Vaping Use   Vaping status: Never Used  Substance Use Topics   Alcohol use: Never   Drug use: Never     Allergies   Poultry meal   Review of Systems Review of Systems  Gastrointestinal:  Positive for abdominal pain.  Genitourinary:  Positive for vaginal discharge.     Physical Exam Triage Vital Signs ED Triage Vitals [06/20/23 1128]  Encounter Vitals Group     BP (!) 144/94     Systolic BP Percentile      Diastolic BP Percentile      Pulse Rate 89  Resp 18     Temp 97.6 F (36.4 C)     Temp Source Oral     SpO2 97 %     Weight      Height      Head Circumference      Peak Flow      Pain Score      Pain Loc      Pain Education      Exclude from Growth Chart    No data found.  Updated Vital Signs BP (!) 144/94 (BP Location: Right Arm)   Pulse 89   Temp 97.6 F (36.4 C) (Oral)   Resp 18   LMP  (LMP Unknown)   SpO2 97%   Visual Acuity Right Eye Distance:   Left Eye Distance:   Bilateral Distance:    Right Eye Near:   Left Eye Near:    Bilateral Near:     Physical Exam Vitals and nursing note reviewed.  Constitutional:      General: She is not in acute distress.    Appearance: She is well-developed.  HENT:     Head:  Normocephalic and atraumatic.  Eyes:     Conjunctiva/sclera: Conjunctivae normal.  Cardiovascular:     Rate and Rhythm: Normal rate and regular rhythm.     Heart sounds: No murmur heard. Pulmonary:     Effort: Pulmonary effort is normal. No respiratory distress.     Breath sounds: Normal breath sounds.  Abdominal:     Palpations: Abdomen is soft.     Tenderness: There is abdominal tenderness in the suprapubic area. There is no right CVA tenderness, left CVA tenderness or rebound. Negative signs include Rovsing's sign and psoas sign.  Musculoskeletal:        General: No swelling.     Cervical back: Neck supple.  Skin:    General: Skin is warm and dry.     Capillary Refill: Capillary refill takes less than 2 seconds.  Neurological:     Mental Status: She is alert.  Psychiatric:        Mood and Affect: Mood normal.      UC Treatments / Results  Labs (all labs ordered are listed, but only abnormal results are displayed) Labs Reviewed  POCT URINALYSIS DIP (MANUAL ENTRY) - Normal  CERVICOVAGINAL ANCILLARY ONLY    EKG   Radiology No results found.  Procedures Procedures (including critical care time)  Medications Ordered in UC Medications - No data to display  Initial Impression / Assessment and Plan / UC Course  I have reviewed the triage vital signs and the nursing notes.  Pertinent labs & imaging results that were available during my care of the patient were reviewed by me and considered in my medical decision making (see chart for details).   Will treat for a yeast infection and follow-up with abnormal swab results and appropriate medications when labs come back.  Additionally recommend that she establish herself with a gynecologist.  Patient in agreement to plan and will follow-up with her PCP as well if needed.      Final Clinical Impressions(s) / UC Diagnoses   Final diagnoses:  Vaginal discharge     Discharge Instructions      I have prescribed a  medication to help treat your yeast infection.  We will follow-up with you with the rest of your swab results in the next 24 to 48 hours and prescribe further medication as needed.  Please make an appointment with the  doctor listed above for further women's health care.     ED Prescriptions     Medication Sig Dispense Auth. Provider   fluconazole  (DIFLUCAN ) 150 MG tablet Take 1 tablet (150 mg total) by mouth every three (3) days as needed. 2 tablet Imogene Mana, NP      PDMP not reviewed this encounter.   Imogene Mana, NP 06/20/23 1416

## 2023-06-20 NOTE — ED Triage Notes (Signed)
 Pt states that she has been having abdominal pain, vaginal itching, vaginal discharge X 2 months.

## 2023-06-20 NOTE — Discharge Instructions (Addendum)
 I have prescribed a medication to help treat your yeast infection.  We will follow-up with you with the rest of your swab results in the next 24 to 48 hours and prescribe further medication as needed.  Please make an appointment with the doctor listed above for further women's health care.

## 2023-06-21 LAB — CERVICOVAGINAL ANCILLARY ONLY
Bacterial Vaginitis (gardnerella): NEGATIVE
Candida Glabrata: NEGATIVE
Candida Vaginitis: NEGATIVE
Chlamydia: NEGATIVE
Comment: NEGATIVE
Comment: NEGATIVE
Comment: NEGATIVE
Comment: NEGATIVE
Comment: NEGATIVE
Comment: NORMAL
Neisseria Gonorrhea: NEGATIVE
Trichomonas: NEGATIVE

## 2023-07-25 DIAGNOSIS — Z1339 Encounter for screening examination for other mental health and behavioral disorders: Secondary | ICD-10-CM | POA: Diagnosis not present

## 2023-07-25 DIAGNOSIS — Z131 Encounter for screening for diabetes mellitus: Secondary | ICD-10-CM | POA: Diagnosis not present

## 2023-07-25 DIAGNOSIS — Z1331 Encounter for screening for depression: Secondary | ICD-10-CM | POA: Diagnosis not present

## 2023-07-25 DIAGNOSIS — Z0000 Encounter for general adult medical examination without abnormal findings: Secondary | ICD-10-CM | POA: Diagnosis not present

## 2023-07-25 DIAGNOSIS — R300 Dysuria: Secondary | ICD-10-CM | POA: Diagnosis not present

## 2023-11-18 ENCOUNTER — Ambulatory Visit (HOSPITAL_COMMUNITY)

## 2023-11-18 ENCOUNTER — Ambulatory Visit (HOSPITAL_COMMUNITY)
Admission: EM | Admit: 2023-11-18 | Discharge: 2023-11-18 | Disposition: A | Discharge status: Home / Self Care | Attending: Physician Assistant | Admitting: Physician Assistant

## 2023-11-18 ENCOUNTER — Encounter (HOSPITAL_COMMUNITY): Payer: Self-pay

## 2023-11-18 DIAGNOSIS — M25561 Pain in right knee: Secondary | ICD-10-CM | POA: Diagnosis not present

## 2023-11-18 MED ORDER — MELOXICAM 7.5 MG PO TABS: 7.5000 mg | ORAL_TABLET | Freq: Every day | ORAL | 0 refills | Status: AC

## 2023-11-18 NOTE — Discharge Instructions (Addendum)
 Wear knee sleeve for comfort. Take Mobic  once daily. Can apply ice and elevate at home.

## 2023-11-18 NOTE — ED Provider Notes (Signed)
 [facility]    CSN: [id number] Arrival date & time: 11/18/23  1012      History   Chief Complaint No chief complaint on file.   HPI Jessica Donaldson is a 48 y.o. female.   Patient presents with right knee pain that started about 2 days ago.  She denies injury or trauma.  She did start a new job recently which requires her to lift up boxes.  She tried no medications.  Pain is worse with walking, lifting, movement.  No previous knee problems.    Past Medical History:  Diagnosis Date   Allergies    Asthma     There are no active problems to display for this patient.   History reviewed. No pertinent surgical history.  OB History   No obstetric history on file.      Home Medications    Prior to Admission medications   Medication Sig Start Date End Date Taking? Authorizing Provider  meloxicam  (MOBIC ) 7.5 MG tablet Take 1 tablet (7.5 mg total) by mouth daily. 11/18/23  Yes Ward, Harlene PEDLAR, PA-C  cetirizine  (ZYRTEC  ALLERGY) 10 MG tablet Take 1 tablet (10 mg total) by mouth daily. 04/30/16   Christopher Savannah, PA-C  cyclobenzaprine  (FLEXERIL ) 10 MG tablet Take 1 tablet (10 mg total) by mouth 3 (three) times daily as needed for muscle spasms (or pain). 07/23/16   Devora Perkins, PA-C  fluconazole  (DIFLUCAN ) 150 MG tablet Take 1 tablet (150 mg total) by mouth every three (3) days as needed. 06/20/23   Remi Pippin, NP  neomycin -polymyxin-hydrocortisone (CORTISPORIN) 3.5-10000-1 OTIC suspension Place 4 drops into both ears 3 (three) times daily. 11/10/16   Leann Satterfield, NP  omeprazole  (PRILOSEC) 20 MG capsule Take 1 capsule (20 mg total) by mouth daily. 04/30/16   Christopher Savannah, PA-C  ranitidine  (ZANTAC ) 150 MG tablet Take 1 tablet (150 mg total) by mouth 2 (two) times daily. 04/30/16   Christopher Savannah, PA-C    Family History History reviewed. No pertinent family history.  Social History Social History   Tobacco Use   Smoking status: Never   Smokeless tobacco: Never  Vaping Use    Vaping status: Never Used  Substance Use Topics   Alcohol  use: Never   Drug use: Never     Allergies   Poultry meal   Review of Systems Review of Systems  Constitutional:  Negative for chills and fever.  HENT:  Negative for ear pain and sore throat.   Eyes:  Negative for pain and visual disturbance.  Respiratory:  Negative for cough and shortness of breath.   Cardiovascular:  Negative for chest pain and palpitations.  Gastrointestinal:  Negative for abdominal pain and vomiting.  Genitourinary:  Negative for dysuria and hematuria.  Musculoskeletal:  Positive for arthralgias (right knee pain). Negative for back pain.  Skin:  Negative for color change and rash.  Neurological:  Negative for seizures and syncope.  All other systems reviewed and are negative.    Physical Exam Triage Vital Signs ED Triage Vitals [11/18/23 1111]  Encounter Vitals Group     BP (!) 160/91     Girls Systolic BP Percentile      Girls Diastolic BP Percentile      Boys Systolic BP Percentile      Boys Diastolic BP Percentile      Pulse Rate 91     Resp 18     Temp 98.5 F (36.9 C)     Temp Source Oral  SpO2 98 %     Weight      Height      Head Circumference      Peak Flow      Pain Score      Pain Loc      Pain Education      Exclude from Growth Chart    No data found.  Updated Vital Signs BP (!) 160/91 (BP Location: Left Arm)   Pulse 91   Temp 98.5 F (36.9 C) (Oral)   Resp 18   SpO2 98%   Visual Acuity Right Eye Distance:   Left Eye Distance:   Bilateral Distance:    Right Eye Near:   Left Eye Near:    Bilateral Near:     Physical Exam Vitals and nursing note reviewed.  Constitutional:      General: She is not in acute distress.    Appearance: She is well-developed.  HENT:     Head: Normocephalic and atraumatic.  Eyes:     Conjunctiva/sclera: Conjunctivae normal.  Cardiovascular:     Rate and Rhythm: Normal rate and regular rhythm.     Heart sounds: No murmur  heard. Pulmonary:     Effort: Pulmonary effort is normal. No respiratory distress.     Breath sounds: Normal breath sounds.  Abdominal:     Palpations: Abdomen is soft.     Tenderness: There is no abdominal tenderness.  Musculoskeletal:        General: No swelling.     Cervical back: Neck supple.     Comments: No right knee swelling, tenderness to palpation joint line.  Normal strength normal range of motion.  Skin:    General: Skin is warm and dry.     Capillary Refill: Capillary refill takes less than 2 seconds.  Neurological:     Mental Status: She is alert.  Psychiatric:        Mood and Affect: Mood normal.      UC Treatments / Results  Labs (all labs ordered are listed, but only abnormal results are displayed) Labs Reviewed - No data to display  EKG   Radiology DG Knee 2 Views Right Result Date: 11/18/2023 CLINICAL DATA:  Right knee pain for 2 days. EXAM: RIGHT KNEE - 1-2 VIEW COMPARISON:  None Available. FINDINGS: No evidence of fracture, dislocation, or joint effusion. No evidence of arthropathy or other focal bone abnormality. Soft tissues are unremarkable. IMPRESSION: Negative. Electronically Signed   By: Camellia Candle M.D.   On: 11/18/2023 11:49    Procedures Procedures (including critical care time)  Medications Ordered in UC Medications - No data to display  Initial Impression / Assessment and Plan / UC Course  I have reviewed the triage vital signs and the nursing notes.  Pertinent labs & imaging results that were available during my care of the patient were reviewed by me and considered in my medical decision making (see chart for details).     Right knee pain.  Imaging negative. Mobic  prescribed.  Knee sleeve given in clinic today.  Supportive care discussed.  Return precautions discussed. Final Clinical Impressions(s) / UC Diagnoses   Final diagnoses:  Acute pain of right knee   Discharge Instructions   None    ED Prescriptions      Medication Sig Dispense Auth. Provider   meloxicam  (MOBIC ) 7.5 MG tablet Take 1 tablet (7.5 mg total) by mouth daily. 30 tablet Ward, Minor Iden Z, PA-C      PDMP not reviewed  this encounter.   Ward, Harlene PEDLAR, PA-C 11/18/23 1216

## 2023-11-18 NOTE — ED Triage Notes (Signed)
 Patient presents to the office for right knee pain x 2 days. Patient denies any injuries to her right knee. Patient reports bilateral hand pain after picking up heavy boxes.  No medication has been taken.
